# Patient Record
Sex: Female | Born: 1955 | Race: White | Hispanic: No | Smoking: Current every day smoker
Health system: Southern US, Community
[De-identification: ages and names within clinical notes are randomized; demographics above are authoritative.]

## PROBLEM LIST (undated history)

## (undated) DIAGNOSIS — F419 Anxiety disorder, unspecified: Secondary | ICD-10-CM

## (undated) DIAGNOSIS — Z8489 Family history of other specified conditions: Secondary | ICD-10-CM

## (undated) DIAGNOSIS — I671 Cerebral aneurysm, nonruptured: Secondary | ICD-10-CM

## (undated) DIAGNOSIS — M8580 Other specified disorders of bone density and structure, unspecified site: Secondary | ICD-10-CM

## (undated) DIAGNOSIS — M1990 Unspecified osteoarthritis, unspecified site: Secondary | ICD-10-CM

## (undated) DIAGNOSIS — T8859XA Other complications of anesthesia, initial encounter: Secondary | ICD-10-CM

## (undated) DIAGNOSIS — B029 Zoster without complications: Secondary | ICD-10-CM

## (undated) DIAGNOSIS — T7840XA Allergy, unspecified, initial encounter: Secondary | ICD-10-CM

## (undated) DIAGNOSIS — R42 Dizziness and giddiness: Secondary | ICD-10-CM

## (undated) HISTORY — PX: ABDOMINAL HYSTERECTOMY: SHX81

## (undated) HISTORY — DX: Allergy, unspecified, initial encounter: T78.40XA

## (undated) HISTORY — DX: Unspecified osteoarthritis, unspecified site: M19.90

## (undated) HISTORY — DX: Zoster without complications: B02.9

## (undated) HISTORY — DX: Other specified disorders of bone density and structure, unspecified site: M85.80

## (undated) HISTORY — DX: Anxiety disorder, unspecified: F41.9

## (undated) HISTORY — PX: CHOLECYSTECTOMY: SHX55

## (undated) HISTORY — PX: FOOT SURGERY: SHX648

## (undated) HISTORY — PX: OTHER SURGICAL HISTORY: SHX169

---

## 1992-01-10 HISTORY — PX: OTHER SURGICAL HISTORY: SHX169

## 1998-08-23 ENCOUNTER — Other Ambulatory Visit: Admission: RE | Admit: 1998-08-23 | Discharge: 1998-08-23 | Payer: Self-pay | Admitting: *Deleted

## 1999-09-07 ENCOUNTER — Other Ambulatory Visit: Admission: RE | Admit: 1999-09-07 | Discharge: 1999-09-07 | Payer: Self-pay | Admitting: *Deleted

## 2000-01-10 HISTORY — PX: NOSE SURGERY: SHX723

## 2000-08-30 ENCOUNTER — Other Ambulatory Visit: Admission: RE | Admit: 2000-08-30 | Discharge: 2000-08-30 | Payer: Self-pay | Admitting: *Deleted

## 2001-09-25 ENCOUNTER — Other Ambulatory Visit: Admission: RE | Admit: 2001-09-25 | Discharge: 2001-09-25 | Payer: Self-pay | Admitting: *Deleted

## 2003-07-06 ENCOUNTER — Other Ambulatory Visit: Admission: RE | Admit: 2003-07-06 | Discharge: 2003-07-06 | Payer: Self-pay | Admitting: *Deleted

## 2004-07-05 ENCOUNTER — Other Ambulatory Visit: Admission: RE | Admit: 2004-07-05 | Discharge: 2004-07-05 | Payer: Self-pay | Admitting: *Deleted

## 2004-08-21 ENCOUNTER — Encounter: Admission: RE | Admit: 2004-08-21 | Discharge: 2004-08-21 | Payer: Self-pay | Admitting: Internal Medicine

## 2005-07-20 ENCOUNTER — Other Ambulatory Visit: Admission: RE | Admit: 2005-07-20 | Discharge: 2005-07-20 | Payer: Self-pay | Admitting: *Deleted

## 2006-08-07 ENCOUNTER — Other Ambulatory Visit: Admission: RE | Admit: 2006-08-07 | Discharge: 2006-08-07 | Payer: Self-pay | Admitting: *Deleted

## 2007-08-12 ENCOUNTER — Other Ambulatory Visit: Admission: RE | Admit: 2007-08-12 | Discharge: 2007-08-12 | Payer: Self-pay | Admitting: Gynecology

## 2007-08-17 ENCOUNTER — Encounter: Admission: RE | Admit: 2007-08-17 | Discharge: 2007-08-17 | Payer: Self-pay | Admitting: Family Medicine

## 2008-08-12 ENCOUNTER — Encounter: Payer: Self-pay | Admitting: Women's Health

## 2008-08-12 ENCOUNTER — Ambulatory Visit: Payer: Self-pay | Admitting: Women's Health

## 2008-08-12 ENCOUNTER — Other Ambulatory Visit: Admission: RE | Admit: 2008-08-12 | Discharge: 2008-08-12 | Payer: Self-pay | Admitting: Gynecology

## 2008-09-10 ENCOUNTER — Encounter: Payer: Self-pay | Admitting: Gastroenterology

## 2008-10-01 ENCOUNTER — Encounter: Payer: Self-pay | Admitting: Gastroenterology

## 2008-11-03 ENCOUNTER — Ambulatory Visit: Payer: Self-pay | Admitting: Gastroenterology

## 2008-11-03 DIAGNOSIS — R1013 Epigastric pain: Secondary | ICD-10-CM | POA: Insufficient documentation

## 2008-11-06 ENCOUNTER — Encounter: Payer: Self-pay | Admitting: Gastroenterology

## 2008-11-06 ENCOUNTER — Ambulatory Visit: Payer: Self-pay | Admitting: Gastroenterology

## 2008-11-06 LAB — CONVERTED CEMR LAB
Amylase: 60 units/L (ref 27–131)
BUN: 5 mg/dL — ABNORMAL LOW (ref 6–23)
Basophils Relative: 1.1 % (ref 0.0–3.0)
CO2: 29 meq/L (ref 19–32)
Creatinine, Ser: 0.6 mg/dL (ref 0.4–1.2)
Eosinophils Relative: 1 % (ref 0.0–5.0)
GFR calc non Af Amer: 111.78 mL/min (ref >60)
Glucose, Bld: 80 mg/dL (ref 70–99)
HCT: 41.7 % (ref 36.0–46.0)
Hemoglobin: 15 g/dL (ref 12.0–15.0)
Lymphs Abs: 3.2 10*3/uL (ref 0.7–4.0)
Monocytes Relative: 5.6 % (ref 3.0–12.0)
Platelets: 244 10*3/uL (ref 150.0–400.0)
RBC: 4.28 M/uL (ref 3.87–5.11)
Total Bilirubin: 0.6 mg/dL (ref 0.3–1.2)
Total Protein: 6 g/dL (ref 6.0–8.3)
WBC: 8.8 10*3/uL (ref 4.5–10.5)

## 2008-11-09 ENCOUNTER — Telehealth: Payer: Self-pay | Admitting: Gastroenterology

## 2008-11-12 ENCOUNTER — Ambulatory Visit: Payer: Self-pay | Admitting: Gastroenterology

## 2008-11-12 ENCOUNTER — Telehealth: Payer: Self-pay | Admitting: Gastroenterology

## 2008-11-12 ENCOUNTER — Ambulatory Visit: Payer: Self-pay | Admitting: Internal Medicine

## 2008-11-18 ENCOUNTER — Ambulatory Visit: Payer: Self-pay | Admitting: Gastroenterology

## 2008-12-01 ENCOUNTER — Ambulatory Visit: Payer: Self-pay | Admitting: Gastroenterology

## 2008-12-01 LAB — CONVERTED CEMR LAB
Alkaline Phosphatase: 76 units/L (ref 39–117)
Bilirubin, Direct: 0 mg/dL (ref 0.0–0.3)
Total Protein: 6.2 g/dL (ref 6.0–8.3)

## 2008-12-02 ENCOUNTER — Telehealth (INDEPENDENT_AMBULATORY_CARE_PROVIDER_SITE_OTHER): Payer: Self-pay | Admitting: *Deleted

## 2008-12-02 ENCOUNTER — Ambulatory Visit (HOSPITAL_COMMUNITY): Admission: RE | Admit: 2008-12-02 | Discharge: 2008-12-02 | Payer: Self-pay | Admitting: Gastroenterology

## 2008-12-14 ENCOUNTER — Ambulatory Visit: Payer: Self-pay | Admitting: Gynecology

## 2008-12-18 ENCOUNTER — Ambulatory Visit (HOSPITAL_COMMUNITY): Admission: RE | Admit: 2008-12-18 | Discharge: 2008-12-18 | Payer: Self-pay | Admitting: Gastroenterology

## 2008-12-22 ENCOUNTER — Telehealth: Payer: Self-pay | Admitting: Gastroenterology

## 2009-01-18 ENCOUNTER — Ambulatory Visit (HOSPITAL_COMMUNITY): Admission: RE | Admit: 2009-01-18 | Discharge: 2009-01-18 | Payer: Self-pay | Admitting: General Surgery

## 2009-04-26 ENCOUNTER — Encounter: Admission: RE | Admit: 2009-04-26 | Discharge: 2009-04-26 | Payer: Self-pay | Admitting: Internal Medicine

## 2009-08-09 DIAGNOSIS — M8580 Other specified disorders of bone density and structure, unspecified site: Secondary | ICD-10-CM

## 2009-08-13 ENCOUNTER — Other Ambulatory Visit: Admission: RE | Admit: 2009-08-13 | Discharge: 2009-08-13 | Payer: Self-pay | Admitting: Gynecology

## 2009-08-13 ENCOUNTER — Ambulatory Visit: Payer: Self-pay | Admitting: Women's Health

## 2009-09-08 ENCOUNTER — Ambulatory Visit: Payer: Self-pay | Admitting: Women's Health

## 2009-11-24 ENCOUNTER — Encounter: Admission: RE | Admit: 2009-11-24 | Discharge: 2009-11-24 | Payer: Self-pay | Admitting: Otolaryngology

## 2010-03-27 LAB — CBC
HCT: 41.7 % (ref 36.0–46.0)
MCV: 96.3 fL (ref 78.0–100.0)
Platelets: 238 10*3/uL (ref 150–400)
RBC: 4.33 MIL/uL (ref 3.87–5.11)
WBC: 9 10*3/uL (ref 4.0–10.5)

## 2010-08-09 ENCOUNTER — Encounter: Payer: Self-pay | Admitting: *Deleted

## 2010-08-15 ENCOUNTER — Ambulatory Visit (INDEPENDENT_AMBULATORY_CARE_PROVIDER_SITE_OTHER): Payer: 59 | Admitting: Women's Health

## 2010-08-15 ENCOUNTER — Other Ambulatory Visit (HOSPITAL_COMMUNITY)
Admission: RE | Admit: 2010-08-15 | Discharge: 2010-08-15 | Disposition: A | Discharge status: Home / Self Care | Payer: 59 | Source: Ambulatory Visit | Attending: Obstetrics and Gynecology | Admitting: Obstetrics and Gynecology

## 2010-08-15 ENCOUNTER — Other Ambulatory Visit: Payer: Self-pay | Admitting: Women's Health

## 2010-08-15 ENCOUNTER — Encounter: Payer: Self-pay | Admitting: Women's Health

## 2010-08-15 VITALS — BP 110/70 | Ht 62.0 in | Wt 146.0 lb

## 2010-08-15 DIAGNOSIS — G4700 Insomnia, unspecified: Secondary | ICD-10-CM

## 2010-08-15 DIAGNOSIS — M8580 Other specified disorders of bone density and structure, unspecified site: Secondary | ICD-10-CM

## 2010-08-15 DIAGNOSIS — F419 Anxiety disorder, unspecified: Secondary | ICD-10-CM

## 2010-08-15 DIAGNOSIS — Z01419 Encounter for gynecological examination (general) (routine) without abnormal findings: Secondary | ICD-10-CM | POA: Insufficient documentation

## 2010-08-15 DIAGNOSIS — F411 Generalized anxiety disorder: Secondary | ICD-10-CM

## 2010-08-15 DIAGNOSIS — M899 Disorder of bone, unspecified: Secondary | ICD-10-CM

## 2010-08-15 MED ORDER — TRIAZOLAM 0.25 MG PO TABS: 0.2500 mg | ORAL_TABLET | Freq: Every evening | ORAL | Status: DC | PRN

## 2010-08-15 MED ORDER — DIAZEPAM 5 MG PO TABS: 5.0000 mg | ORAL_TABLET | Freq: Four times a day (QID) | ORAL | Status: AC | PRN

## 2010-08-15 NOTE — Progress Notes (Signed)
Sandra Waller 05/24/1955 [id number]    History:    The patient presents for annual exam.  55 yo MWF G2P2 LAVH with BSO in 52 for endometriosis. She is on no ERT.  She did negative colonoscopy in 2000 and, had her gallbladder  removed in January 2011. She had a bone density August of 2011 that showed osteopenia with a significant decrease at the spine. Spine showed a Tscore -2.4. She was to return to the office for labs and followup with Dr. Audie Box. She did not do that due to time getting away from her but states will schedule appointment with Dr. Audie Box to discuss.  She has had some problems with insomnia she does have her labs and Halcion 0.25 from her primary care but states does need  a prescription for that at this time.   Past medical history, past surgical history, family history and social history were all reviewed and documented in the EPIC chart.   ROS:  A 14 point ROS was performed and pertinent positives and negatives are included in the history.  Exam:  Filed Vitals:   08/15/10 1044  BP: 110/70    General appearance:  Normal Head/Neck:  Normal, without cervical or supraclavicular adenopathy. Thyroid:  Symmetrical, normal in size, without palpable masses or nodularity. Respiratory  Effort:  Normal  Auscultation:  Clear without wheezing or rhonchi Cardiovascular  Auscultation:  Regular rate, without rubs, murmurs or gallops  Edema/varicosities:  Not grossly evident Abdominal  Masses/tenderness:  Soft,nontender, without masses, guarding or rebound.  Liver/spleen:  No organomegaly noted  Hernia:  None appreciated  Occult test:   Skin  Inspection:  Grossly normal  Palpation:  Grossly normal Neurologic/psychiatric  Orientation:  Normal with appropriate conversation.  Mood/affect:  Normal  Genitourinary    Breasts: Examined lying and sitting.     Right: Without masses, retractions, discharge or axillary adenopathy.     Left: Without masses, retractions,  discharge or axillary adenopathy.   Inguinal/mons:  Normal without inguinal adenopathy  External genitalia:  Normal  BUS/Urethra/Skene's glands:  Normal  Bladder:  Normal  Vagina:  Normal  Cervix:  absent  Uterus: absent   Adnexa/parametria:     Rt: Without masses or tenderness.   Lt: Without masses or tenderness.  Anus and perineum: Normal  Digital rectal exam: Normal sphincter tone without palpated masses or tenderness  Assessment/Plan:  55 y.o. year old female for annual exam.   Will check a vitamin D, comprehensive metabolic panel., TFTs, and PTH, will schedule an office visit with Dr. Audie Box to discuss her osteopenia. Reviewed importance of fall prevention, smoking cessation. She states she does not feel she can quit smoking at this time. Will try to cut down, her father has died this year and is trying to settle his estate, he lived in [place] which has made it problematic. SBE's, yearly mammogram which was normal last month. Encouraged increased exercise, calcium rich diet vitamin D 2000 daily. Also does take occasional Valium which helps with vertigo but she states she rarely takes that. Did review not to take with the Halcion for rest. Sleep hygiene reviewed. UA and Pap today, she has her other labs done at her primary care.    Harrington Challenger MD, 11:54 AM 08/15/2010

## 2010-08-16 ENCOUNTER — Encounter: Payer: Self-pay | Admitting: *Deleted

## 2010-08-16 LAB — PTH, INTACT AND CALCIUM
Calcium, Total (PTH): 9.4 mg/dL (ref 8.4–10.5)
PTH: 30.6 pg/mL (ref 14.0–72.0)

## 2010-08-16 LAB — COMPREHENSIVE METABOLIC PANEL
CO2: 27 mEq/L (ref 19–32)
Creat: 0.69 mg/dL (ref 0.50–1.10)
Glucose, Bld: 76 mg/dL (ref 70–99)
Total Bilirubin: 0.4 mg/dL (ref 0.3–1.2)

## 2010-08-16 LAB — VITAMIN D 25 HYDROXY (VIT D DEFICIENCY, FRACTURES): Vit D, 25-Hydroxy: 48 ng/mL (ref 30–89)

## 2010-08-16 NOTE — Progress Notes (Signed)
  Patient was seen 8/6 by Sandra Waller [facility] was given 2 rxs they were valium 5mg  and halcion 0.25mg . Pt was given rxs without signatures therefore patient walked in today and wanted them signed, [place] not here therefore the rxs were called into [facility].

## 2010-08-17 LAB — TSH: TSH: 0.519 u[IU]/mL (ref 0.350–4.500)

## 2010-08-25 ENCOUNTER — Ambulatory Visit (INDEPENDENT_AMBULATORY_CARE_PROVIDER_SITE_OTHER): Payer: 59 | Admitting: Gynecology

## 2010-08-25 ENCOUNTER — Encounter: Payer: Self-pay | Admitting: Gynecology

## 2010-08-25 DIAGNOSIS — M899 Disorder of bone, unspecified: Secondary | ICD-10-CM

## 2010-08-25 DIAGNOSIS — M8580 Other specified disorders of bone density and structure, unspecified site: Secondary | ICD-10-CM

## 2010-08-25 MED ORDER — RISEDRONATE SODIUM 150 MG PO TABS: 150.0000 mg | ORAL_TABLET | ORAL | Status: DC

## 2010-08-25 NOTE — Progress Notes (Signed)
55 year old presents to discuss her bone density that was done one year ago showing a -2.4 T score at the AP spine. There was a statistically significant decline from her previous study 2 years prior 2009. She was to followup to discuss this but never did and now returns a year later. She had a recent PTH TSH calcium vitamin D all of which were normal. Her Z scores were also noted to be normal. I had lengthy discussion with the patient as far as osteopenia, -2.4 value, NOF guidelines, FRAX results and her history of cigarette smoking. I reviewed treatment options to include nonpharmacologic such as stop smoking increase weightbearing exercise as well as pharmacologic options to include bisphosphonates, Prolia, Evista and Forteo. After lengthy discussion we both agree on initiating Actonel 150 mg monthly. I reviewed how to take the medication side effect profile as well as the risks to include GERD, left untreated possible esophageal cancer, osteonecrosis of the jaw an atypical fracture risk. Patient understands and accepts he should go ahead and start her monthly dose we'll plan on repeating a bone density next year which will make it a 2 year interval and then we'll go from there.

## 2010-08-28 ENCOUNTER — Emergency Department (HOSPITAL_COMMUNITY)
Admission: EM | Admit: 2010-08-28 | Discharge: 2010-08-28 | Disposition: A | Discharge status: Home / Self Care | Payer: 59 | Attending: Emergency Medicine | Admitting: Emergency Medicine

## 2010-08-28 DIAGNOSIS — R079 Chest pain, unspecified: Secondary | ICD-10-CM | POA: Insufficient documentation

## 2010-08-28 DIAGNOSIS — M810 Age-related osteoporosis without current pathological fracture: Secondary | ICD-10-CM | POA: Insufficient documentation

## 2010-08-28 DIAGNOSIS — Z79899 Other long term (current) drug therapy: Secondary | ICD-10-CM | POA: Insufficient documentation

## 2010-08-28 LAB — COMPREHENSIVE METABOLIC PANEL
ALT: 11 U/L (ref 0–35)
AST: 19 U/L (ref 0–37)
Calcium: 9.4 mg/dL (ref 8.4–10.5)
Sodium: 138 mEq/L (ref 135–145)
Total Protein: 6.5 g/dL (ref 6.0–8.3)

## 2010-08-28 LAB — CK TOTAL AND CKMB (NOT AT ARMC): Relative Index: INVALID (ref 0.0–2.5)

## 2010-09-26 ENCOUNTER — Telehealth: Payer: Self-pay | Admitting: *Deleted

## 2010-09-26 DIAGNOSIS — M8580 Other specified disorders of bone density and structure, unspecified site: Secondary | ICD-10-CM

## 2010-09-26 MED ORDER — ALENDRONATE SODIUM 70 MG PO TABS: 70.0000 mg | ORAL_TABLET | ORAL | Status: DC

## 2010-09-26 NOTE — Telephone Encounter (Signed)
Spoke with pt and she would like to try the fosamax 70 mg weekly and see how this works.

## 2010-09-26 NOTE — Telephone Encounter (Signed)
Pt called stating the actonel 150 mg given on office visit on 08/25/10 gave her chest pain last month. Pt has not taken medication this month, but she wanted to follow up with you as directed. Please advise

## 2010-09-26 NOTE — Telephone Encounter (Signed)
Options would be trial of Fosamax 70 mg weekly. Lower dose may avoid some of the GI upset. Alternatives would be Prolia injection 2 times per year or Reclast once a year IV. I think if it's purely GI intolerance that Reclast yearly would be the best choice and we can make arrangements to set this up if she would like.

## 2010-12-20 ENCOUNTER — Other Ambulatory Visit: Payer: Self-pay | Admitting: Otolaryngology

## 2010-12-20 DIAGNOSIS — H9319 Tinnitus, unspecified ear: Secondary | ICD-10-CM

## 2010-12-23 ENCOUNTER — Other Ambulatory Visit: Payer: Self-pay | Admitting: Otolaryngology

## 2010-12-23 DIAGNOSIS — H9319 Tinnitus, unspecified ear: Secondary | ICD-10-CM

## 2010-12-24 ENCOUNTER — Ambulatory Visit
Admission: RE | Admit: 2010-12-24 | Discharge: 2010-12-24 | Disposition: A | Discharge status: Home / Self Care | Payer: 59 | Source: Ambulatory Visit | Attending: Otolaryngology | Admitting: Otolaryngology

## 2010-12-24 DIAGNOSIS — H9319 Tinnitus, unspecified ear: Secondary | ICD-10-CM

## 2010-12-24 MED ORDER — GADOBENATE DIMEGLUMINE 529 MG/ML IV SOLN
12.0000 mL | Freq: Once | INTRAVENOUS | Status: AC | PRN
  Administered 2010-12-24: 12 mL via INTRAVENOUS

## 2010-12-28 ENCOUNTER — Other Ambulatory Visit: Payer: Self-pay | Admitting: Otolaryngology

## 2010-12-28 DIAGNOSIS — H7490 Unspecified disorder of middle ear and mastoid, unspecified ear: Secondary | ICD-10-CM

## 2010-12-28 DIAGNOSIS — H8110 Benign paroxysmal vertigo, unspecified ear: Secondary | ICD-10-CM

## 2010-12-28 DIAGNOSIS — H93A9 Pulsatile tinnitus, unspecified ear: Secondary | ICD-10-CM

## 2011-01-11 ENCOUNTER — Ambulatory Visit
Admission: RE | Admit: 2011-01-11 | Discharge: 2011-01-11 | Disposition: A | Discharge status: Home / Self Care | Payer: 59 | Source: Ambulatory Visit | Attending: Otolaryngology | Admitting: Otolaryngology

## 2011-01-11 DIAGNOSIS — H7490 Unspecified disorder of middle ear and mastoid, unspecified ear: Secondary | ICD-10-CM

## 2011-01-11 DIAGNOSIS — H93A9 Pulsatile tinnitus, unspecified ear: Secondary | ICD-10-CM

## 2011-01-11 DIAGNOSIS — H8110 Benign paroxysmal vertigo, unspecified ear: Secondary | ICD-10-CM

## 2011-01-11 MED ORDER — IOHEXOL 300 MG/ML  SOLN
75.0000 mL | Freq: Once | INTRAMUSCULAR | Status: AC | PRN
  Administered 2011-01-11: 75 mL via INTRAVENOUS

## 2011-01-20 ENCOUNTER — Other Ambulatory Visit (HOSPITAL_COMMUNITY): Payer: Self-pay | Admitting: Neurology

## 2011-01-20 DIAGNOSIS — H93A9 Pulsatile tinnitus, unspecified ear: Secondary | ICD-10-CM

## 2011-01-24 ENCOUNTER — Encounter (HOSPITAL_COMMUNITY): Payer: Self-pay | Admitting: Pharmacy Technician

## 2011-01-26 ENCOUNTER — Other Ambulatory Visit: Payer: Self-pay | Admitting: Radiology

## 2011-01-31 ENCOUNTER — Encounter (HOSPITAL_COMMUNITY): Payer: Self-pay

## 2011-01-31 ENCOUNTER — Other Ambulatory Visit (HOSPITAL_COMMUNITY): Payer: Self-pay | Admitting: Neurology

## 2011-01-31 ENCOUNTER — Ambulatory Visit (HOSPITAL_COMMUNITY)
Admission: RE | Admit: 2011-01-31 | Discharge: 2011-01-31 | Disposition: A | Discharge status: Home / Self Care | Payer: 59 | Source: Ambulatory Visit | Attending: Neurology | Admitting: Neurology

## 2011-01-31 DIAGNOSIS — H93A9 Pulsatile tinnitus, unspecified ear: Secondary | ICD-10-CM

## 2011-01-31 DIAGNOSIS — R9409 Abnormal results of other function studies of central nervous system: Secondary | ICD-10-CM | POA: Insufficient documentation

## 2011-01-31 DIAGNOSIS — H9319 Tinnitus, unspecified ear: Secondary | ICD-10-CM | POA: Insufficient documentation

## 2011-01-31 DIAGNOSIS — I671 Cerebral aneurysm, nonruptured: Secondary | ICD-10-CM | POA: Insufficient documentation

## 2011-01-31 LAB — BASIC METABOLIC PANEL
CO2: 26 mEq/L (ref 19–32)
Chloride: 107 mEq/L (ref 96–112)
Creatinine, Ser: 0.69 mg/dL (ref 0.50–1.10)
Sodium: 141 mEq/L (ref 135–145)

## 2011-01-31 LAB — DIFFERENTIAL
Eosinophils Relative: 3 % (ref 0–5)
Lymphocytes Relative: 36 % (ref 12–46)
Lymphs Abs: 2.9 10*3/uL (ref 0.7–4.0)
Monocytes Absolute: 0.7 10*3/uL (ref 0.1–1.0)
Monocytes Relative: 9 % (ref 3–12)

## 2011-01-31 LAB — CBC
HCT: 42.4 % (ref 36.0–46.0)
MCV: 90 fL (ref 78.0–100.0)
RBC: 4.71 MIL/uL (ref 3.87–5.11)
WBC: 8.1 10*3/uL (ref 4.0–10.5)

## 2011-01-31 LAB — APTT: aPTT: 32 seconds (ref 24–37)

## 2011-01-31 MED ORDER — IOHEXOL 300 MG/ML  SOLN
150.0000 mL | Freq: Once | INTRAMUSCULAR | Status: AC | PRN
  Administered 2011-01-31: 75 mL via INTRAVENOUS

## 2011-01-31 MED ORDER — ONDANSETRON HCL 4 MG/2ML IJ SOLN
INTRAMUSCULAR | Status: AC | PRN
  Administered 2011-01-31: 4 mg via INTRAVENOUS

## 2011-01-31 MED ORDER — MIDAZOLAM HCL 2 MG/2ML IJ SOLN
INTRAMUSCULAR | Status: AC
  Filled 2011-01-31: qty 4

## 2011-01-31 MED ORDER — FLUMAZENIL 0.5 MG/5ML IV SOLN
INTRAVENOUS | Status: AC
  Filled 2011-01-31: qty 5

## 2011-01-31 MED ORDER — FENTANYL CITRATE 0.05 MG/ML IJ SOLN
INTRAMUSCULAR | Status: AC | PRN
  Administered 2011-01-31: 25 ug via INTRAVENOUS

## 2011-01-31 MED ORDER — SODIUM CHLORIDE 0.9 % IV BOLUS (SEPSIS)
250.0000 mL | Freq: Once | INTRAVENOUS | Status: AC
  Administered 2011-01-31: 250 mL via INTRAVENOUS

## 2011-01-31 MED ORDER — SODIUM CHLORIDE 0.9 % IV SOLN: INTRAVENOUS | Status: DC

## 2011-01-31 MED ORDER — ONDANSETRON HCL 4 MG/2ML IJ SOLN
INTRAMUSCULAR | Status: AC
  Filled 2011-01-31: qty 2

## 2011-01-31 MED ORDER — HEPARIN SOD (PORK) LOCK FLUSH 100 UNIT/ML IV SOLN
500.0000 [IU] | Freq: Once | INTRAVENOUS | Status: AC
  Administered 2011-01-31: 500 [IU] via INTRAVENOUS

## 2011-01-31 MED ORDER — NALOXONE HCL 1 MG/ML IJ SOLN
INTRAMUSCULAR | Status: AC
  Filled 2011-01-31: qty 2

## 2011-01-31 MED ORDER — SODIUM CHLORIDE 0.9 % IV BOLUS (SEPSIS): 250.0000 mL | Freq: Once | INTRAVENOUS | Status: DC

## 2011-01-31 MED ORDER — SODIUM CHLORIDE 0.9 % IV SOLN: INTRAVENOUS | Status: AC

## 2011-01-31 MED ORDER — MIDAZOLAM HCL 5 MG/5ML IJ SOLN
INTRAMUSCULAR | Status: AC | PRN
  Administered 2011-01-31: 1 mg via INTRAVENOUS

## 2011-01-31 MED ORDER — FENTANYL CITRATE 0.05 MG/ML IJ SOLN
INTRAMUSCULAR | Status: AC
  Filled 2011-01-31: qty 4

## 2011-01-31 NOTE — H&P (Signed)
Sandra Waller is an 56 y.o. female.   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 Osteopenia 08/2009    T-score -2.4 AP spine    Past Surgical History  Procedure Date  . Lavh bso     OVARIAN CYSTS AND ENDOMETRIOSIS  . Abdominal hysterectomy     LAVH  . Nose surgery 2002  . Cysts on wrist removed 89  . Left shoudler surgery   . Cholecystectomy     Family History  Problem Relation Age of Onset  . Diabetes Mother   . Hypertension Sister   . Diabetes Father    Social History:  reports that she has been smoking Cigarettes.  She has a 15 pack-year smoking history. She has never used smokeless tobacco. She reports that she does not drink alcohol or use illicit drugs.  Allergies:  Allergies  Allergen Reactions  . Celebrex (Celecoxib) Nausea Only  . Z-Pak (Azithromycin) Nausea Only  . Adhesive (Tape) Rash    Medications Prior to Admission  Medication Sig Dispense Refill  . alendronate (FOSAMAX) 70 MG tablet Take 70 mg by mouth every 7 (seven) days. Take with a full glass of water on an empty stomach.      . diazepam (VALIUM) 5 MG tablet Take 5 mg by mouth at bedtime as needed.      Marland Kitchen glucosamine-chondroitin 500-400 MG tablet Take 1 tablet by mouth daily.      . Multiple Vitamins-Minerals (MULTIVITAMINS THER. W/MINERALS) TABS Take 1 tablet by mouth daily.      Marland Kitchen OVER THE COUNTER MEDICATION Take 1 scoop by mouth daily. Total Soy supplement shake      . triazolam (HALCION) 0.25 MG tablet Take 0.25 mg by mouth at bedtime.       Medications Prior to Admission  Medication Dose Route Frequency Provider Last Rate Last Dose  . 0.9 %  sodium chloride infusion   Intravenous Continuous Robet Leu, PA        No results found for this or any previous visit (from the past 48 hour(s)). No results found.  Review of Systems    Constitutional: Negative for fever and chills.  Respiratory: Negative for cough.   Cardiovascular: Negative for chest pain.  Gastrointestinal: Negative for nausea, vomiting and abdominal pain.  Neurological: Negative for headaches.    Blood pressure 97/62, pulse 84, temperature 97 F (36.1 C), temperature source Oral, resp. rate 18, height 5\' 2"  (1.575 m), weight 142 lb (64.411 kg), SpO2 98.00%. Physical Exam  Constitutional: She is oriented to person, place, and time. She appears well-developed and well-nourished.  HENT:  Head: Normocephalic.  Eyes: EOM are normal.  Neck: Normal range of motion.  Cardiovascular: Normal rate, regular rhythm and normal heart sounds.   No murmur heard. Respiratory: Effort normal and breath sounds normal. She has no wheezes.  GI: Soft. Bowel sounds are normal. There is no tenderness.  Musculoskeletal: Normal range of motion.  Neurological: She is alert and oriented to person, place, and time.  Skin: Skin is warm and dry.     Assessment/Plan Rt pulsatile tinnitus-resolved; but has had abn MRA that shows R ICA poss aneurysm Scheduled now for cerebral arteriogram  Aware of procedure benefits and risks and agreeable to proceed. Consent signed.  Sandra Waller A 01/31/2011, 8:45 AM

## 2011-01-31 NOTE — Procedures (Signed)
S/P cerebral arteriogram 4 vessel. Rt CFA approach. Preliminary findings. 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 No acute complications. Full report to follow.

## 2011-02-01 ENCOUNTER — Other Ambulatory Visit (HOSPITAL_COMMUNITY): Payer: Self-pay | Admitting: Interventional Radiology

## 2011-02-01 DIAGNOSIS — H93A9 Pulsatile tinnitus, unspecified ear: Secondary | ICD-10-CM

## 2011-02-06 ENCOUNTER — Other Ambulatory Visit (HOSPITAL_COMMUNITY): Payer: Self-pay | Admitting: Neurology

## 2011-02-06 DIAGNOSIS — H93A9 Pulsatile tinnitus, unspecified ear: Secondary | ICD-10-CM

## 2011-02-14 ENCOUNTER — Ambulatory Visit (HOSPITAL_COMMUNITY): Payer: 59

## 2011-03-11 ENCOUNTER — Other Ambulatory Visit: Payer: Self-pay | Admitting: Women's Health

## 2011-03-13 NOTE — Telephone Encounter (Signed)
rx called in

## 2011-03-13 NOTE — Telephone Encounter (Signed)
Telephone call to patient, states needs Halcion to sleep. States takes no other medications. Sleep hygiene reviewed. RX called in.

## 2011-05-18 ENCOUNTER — Ambulatory Visit (INDEPENDENT_AMBULATORY_CARE_PROVIDER_SITE_OTHER): Payer: 59 | Admitting: Family Medicine

## 2011-05-18 DIAGNOSIS — B029 Zoster without complications: Secondary | ICD-10-CM

## 2011-05-18 MED ORDER — VALACYCLOVIR HCL 1 G PO TABS: 1000.0000 mg | ORAL_TABLET | Freq: Three times a day (TID) | ORAL | Status: DC

## 2011-05-18 MED ORDER — GABAPENTIN 100 MG PO CAPS: ORAL_CAPSULE | ORAL | Status: DC

## 2011-05-18 MED ORDER — PREDNISONE 20 MG PO TABS: 20.0000 mg | ORAL_TABLET | Freq: Every day | ORAL | Status: AC

## 2011-05-18 NOTE — Progress Notes (Signed)
  Subjective:    Patient ID: Sandra Waller, female    DOB: [birth year], 56 y.o.   MRN: [id number]  HPI  (R) flank parathesia's without rash in a dermatomal distribution No history of Zoster  No recent illness or stressors No known thorasic spine disease  Review of Systems     Objective:   Physical Exam  Constitutional: She appears well-developed.  Cardiovascular: Normal rate, regular rhythm and normal heart sounds.   Pulmonary/Chest: Effort normal and breath sounds normal.  Abdominal: Soft. Bowel sounds are normal.  Skin:       Dysesthesia's along (R) flank extending over to (R) upper abdomen        Assessment & Plan:  Herpes Zoster; presumed  Valtrex 1 gm TID X 7 days Prednisone 20 mg 2 daily for 7 days Neurontin 100 mg see taper to 300 mg daily

## 2011-06-03 ENCOUNTER — Other Ambulatory Visit: Payer: Self-pay | Admitting: Gynecology

## 2011-06-10 DIAGNOSIS — B029 Zoster without complications: Secondary | ICD-10-CM

## 2011-06-10 HISTORY — DX: Zoster without complications: B02.9

## 2011-06-29 ENCOUNTER — Other Ambulatory Visit: Payer: Self-pay | Admitting: Family Medicine

## 2011-07-04 ENCOUNTER — Ambulatory Visit (INDEPENDENT_AMBULATORY_CARE_PROVIDER_SITE_OTHER): Payer: 59 | Admitting: Physician Assistant

## 2011-07-04 VITALS — BP 114/75 | HR 81 | Temp 97.9°F | Resp 16 | Ht 61.5 in | Wt 143.2 lb

## 2011-07-04 DIAGNOSIS — R590 Localized enlarged lymph nodes: Secondary | ICD-10-CM

## 2011-07-04 DIAGNOSIS — R599 Enlarged lymph nodes, unspecified: Secondary | ICD-10-CM

## 2011-07-04 DIAGNOSIS — R51 Headache: Secondary | ICD-10-CM

## 2011-07-04 DIAGNOSIS — B029 Zoster without complications: Secondary | ICD-10-CM

## 2011-07-04 MED ORDER — FLUTICASONE PROPIONATE 50 MCG/ACT NA SUSP: 2.0000 | Freq: Every day | NASAL | Status: DC

## 2011-07-04 MED ORDER — HYDROCODONE-ACETAMINOPHEN 5-500 MG PO TABS: 1.0000 | ORAL_TABLET | Freq: Three times a day (TID) | ORAL | Status: AC | PRN

## 2011-07-04 MED ORDER — KETOROLAC TROMETHAMINE 60 MG/2ML IM SOLN
60.0000 mg | Freq: Once | INTRAMUSCULAR | Status: AC
  Administered 2011-07-04: 60 mg via INTRAMUSCULAR

## 2011-07-04 MED ORDER — IBUPROFEN 800 MG PO TABS: 800.0000 mg | ORAL_TABLET | Freq: Three times a day (TID) | ORAL | Status: AC | PRN

## 2011-07-04 NOTE — Progress Notes (Signed)
  Subjective:    Patient ID: Sandra Waller, female    DOB: November 27, 1955, 56 y.o.   MRN: [id number]  HPI56 yr old CF here with Shingles on her head and forehead.  She was diagnosed with shingles 1 month ago by Dr. Alma Downs. It was present on her R side/hip area.  She took valtrex.  Last week she was in [place] and started having headaches and rash erupted.  She saw an MD there and was prescribed Valtrex which she has been on for 5 days now.  She started prednisone 2 days ago that Dr. Alma Downs had given her last month but she didn't need then.  She has an appointment with an opthalmologist at noon today bc her L eye is bothering her.  She is having painful headaches, pain in L ear.  She came in today bc she was concerned about 2 lymph nodes behind her L ear that just popped up.  They are very tender.  Sinuses also painful from congestion.  Review of Systems  All other systems reviewed and are negative.       Objective:   Physical Exam  Nursing note and vitals reviewed. Constitutional: She is oriented to person, place, and time. She appears well-developed and well-nourished.  HENT:  Head: Normocephalic and atraumatic.  Right Ear: External ear normal.  Mouth/Throat: Oropharynx is clear and moist. No oropharyngeal exudate.       L TM and canal with mild erythema(not infection). No discreet vesicles.  Eyes: EOM are normal. Pupils are equal, round, and reactive to light.       L eye with mild erythema.  (Not stained bc she is immediately going to ophthalmologist from here).  Neck: Normal range of motion. Neck supple.       #2 <1cm mobile, tender, and shotty post-auricular lymph nodes behind L ear  Cardiovascular: Normal rate, regular rhythm and normal heart sounds.   Pulmonary/Chest: Effort normal and breath sounds normal.  Lymphadenopathy:    She has no cervical adenopathy.  Neurological: She is alert and oriented to person, place, and time. No cranial nerve deficit.  Skin: Skin is warm, dry and  intact. Rash noted. Rash is vesicular.             Assessment & Plan:  Shingles Headache-caused by #1 Post-auricular Lymphadenopthy-secondary to #1 Toradol IM now.  Continue valtrex and prednisone.  Go directly to opthalmology appt.  Recheck in 2 days.  Re-examine for LN in 3-4 weeks.  Rx handwritten for shingles vaccine when well.

## 2011-07-12 ENCOUNTER — Ambulatory Visit (INDEPENDENT_AMBULATORY_CARE_PROVIDER_SITE_OTHER): Payer: 59 | Admitting: Family Medicine

## 2011-07-12 VITALS — BP 108/62 | HR 80 | Temp 98.0°F | Resp 16 | Ht 62.5 in | Wt 145.2 lb

## 2011-07-12 DIAGNOSIS — G479 Sleep disorder, unspecified: Secondary | ICD-10-CM

## 2011-07-12 DIAGNOSIS — B0229 Other postherpetic nervous system involvement: Secondary | ICD-10-CM

## 2011-07-12 DIAGNOSIS — R51 Headache: Secondary | ICD-10-CM

## 2011-07-12 MED ORDER — TRIAZOLAM 0.25 MG PO TABS: ORAL_TABLET | ORAL | Status: DC

## 2011-07-12 MED ORDER — GABAPENTIN 300 MG PO CAPS: 300.0000 mg | ORAL_CAPSULE | Freq: Three times a day (TID) | ORAL | Status: DC

## 2011-07-12 NOTE — Patient Instructions (Signed)
Progress report on the gabapentin until taking 900 mg daily Take the sleeping medication 2 pills at bedtime. Cut back if causing side effects.

## 2011-07-12 NOTE — Progress Notes (Signed)
Subjective: Patient continues with a little left-sided headache day and night since having the shingles. She tried the gabapentin for a little while and didn't seem to help much she did not continue it. She still has a lot of it. She's been trying the pain pills and continues to her sleeping medicine. She's not sleeping well.  Objective: Tenderness in left side of the scalp. Skin has healed up well. TMs normal. Eyes PERRLA. Throat clear. Neck supple without nodes.  Assessment: Postherpetic neuralgia and associated headache Sleep disturbance  Plan Push the Neurontin up to 300 mg 3 times a day. Increase the Halcion to 0.5 mg at bedtime (2x0.25 (  Return in 2-3 weeks, sooner if worse.  Discussed possible side effects of pushing the medicine doses.

## 2011-07-19 ENCOUNTER — Other Ambulatory Visit: Payer: Self-pay | Admitting: Neurology

## 2011-07-19 DIAGNOSIS — I729 Aneurysm of unspecified site: Secondary | ICD-10-CM

## 2011-07-22 ENCOUNTER — Ambulatory Visit
Admission: RE | Admit: 2011-07-22 | Discharge: 2011-07-22 | Disposition: A | Discharge status: Home / Self Care | Payer: 59 | Source: Ambulatory Visit | Attending: Neurology | Admitting: Neurology

## 2011-07-22 DIAGNOSIS — I729 Aneurysm of unspecified site: Secondary | ICD-10-CM

## 2011-08-07 ENCOUNTER — Encounter: Payer: Self-pay | Admitting: Women's Health

## 2011-08-28 ENCOUNTER — Encounter: Payer: Self-pay | Admitting: Women's Health

## 2011-08-28 ENCOUNTER — Ambulatory Visit (INDEPENDENT_AMBULATORY_CARE_PROVIDER_SITE_OTHER): Payer: 59 | Admitting: Women's Health

## 2011-08-28 VITALS — BP 108/70 | Ht 62.25 in | Wt 150.0 lb

## 2011-08-28 DIAGNOSIS — Z01419 Encounter for gynecological examination (general) (routine) without abnormal findings: Secondary | ICD-10-CM

## 2011-08-28 DIAGNOSIS — M949 Disorder of cartilage, unspecified: Secondary | ICD-10-CM

## 2011-08-28 DIAGNOSIS — M8580 Other specified disorders of bone density and structure, unspecified site: Secondary | ICD-10-CM

## 2011-08-28 DIAGNOSIS — M899 Disorder of bone, unspecified: Secondary | ICD-10-CM

## 2011-08-28 MED ORDER — ALENDRONATE SODIUM 70 MG PO TABS: 70.0000 mg | ORAL_TABLET | ORAL | Status: DC

## 2011-08-28 NOTE — Progress Notes (Signed)
Sandra Waller February 12, 1955 [id number]    History:    The patient presents for annual exam.  Postmenopausal,  TVH with BSO in 89 for ovarian cysts and endometriosis. History of osteopenia diagnosed in 2009, last DEXA 2011 showed a T score of -2.4 at spine, bilateral hip average T score -0.8, left femoral neck T score -1.5, on Fosamax 70 weekly here. History of normal Paps, mammograms have shown small nodules that were stable and is now back to annual screens.. 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 Vela Prose, diagnosed on CT scan, done due to vertigo, states has gotten better. Normal colonoscopy in 2011. Had shingles 05/2011.   Past medical history, past surgical history, family history and social history were all reviewed and documented in the EPIC chart. 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 2 grown sons both doing well.   ROS:  A  ROS was performed and pertinent positives and negatives are included in the history.  Exam:  Filed Vitals:   08/28/11 0837  BP: 108/70    General appearance:  Normal Head/Neck:  Normal, without cervical or supraclavicular adenopathy. Thyroid:  Symmetrical, normal in size, without palpable masses or nodularity. Respiratory  Effort:  Normal  Auscultation:  Clear without wheezing or rhonchi Cardiovascular  Auscultation:  Regular rate, without rubs, murmurs or gallops  Edema/varicosities:  Not grossly evident Abdominal  Soft,nontender, without masses, guarding or rebound.  Liver/spleen:  No organomegaly noted  Hernia:  None appreciated  Skin  Inspection:  Grossly normal  Palpation:  Grossly normal Neurologic/psychiatric  Orientation:  Normal with appropriate conversation.  Mood/affect:  Normal  Genitourinary    Breasts: Examined lying and sitting.     Right: Without masses, retractions, discharge or axillary adenopathy.     Left: Without masses, retractions, discharge or axillary  adenopathy.   Inguinal/mons:  Normal without inguinal adenopathy  External genitalia:  Normal  BUS/Urethra/Skene's glands:  Normal  Bladder:  Normal  Vagina:  Normal  Cervix:  Absent  Uterus:  Absent  Adnexa/parametria:     Rt: Without masses or tenderness.   Lt: Without masses or tenderness.  Anus and perineum: Normal  Digital rectal exam: Normal sphincter tone without palpated masses or tenderness  Assessment/Plan:  56 y.o. M. WF G2 P2 for annual exam with no complaints.  Postmenopausal  LVH with BSO on no ERT Osteopenia T score of -2.4 at spine 2011 Smoker half pack per day Labs primary care  Plan: Home Hemoccult card given with instructions. SBE's, continue annual mammogram, calcium rich diet, vitamin D 2000 daily. Keep scheduled DEXA appointment, Fosamax 70 weekly, states has trouble remembering. Reviewed importance of increasing regular exercise for general health and bone health. Aware of hazards of smoking discussed ways to decrease and quit.      Harrington Challenger WHNP, 9:08 AM 08/28/2011

## 2011-08-28 NOTE — Patient Instructions (Signed)

## 2011-08-29 LAB — URINALYSIS W MICROSCOPIC + REFLEX CULTURE
Bacteria, UA: NONE SEEN
Casts: NONE SEEN
Hgb urine dipstick: NEGATIVE
Ketones, ur: NEGATIVE mg/dL
Nitrite: NEGATIVE
Urobilinogen, UA: 0.2 mg/dL (ref 0.0–1.0)
pH: 7 (ref 5.0–8.0)

## 2011-09-01 ENCOUNTER — Encounter: Payer: Self-pay | Admitting: Women's Health

## 2011-09-06 ENCOUNTER — Other Ambulatory Visit: Payer: Self-pay | Admitting: Gynecology

## 2011-09-06 DIAGNOSIS — M8580 Other specified disorders of bone density and structure, unspecified site: Secondary | ICD-10-CM

## 2011-10-10 ENCOUNTER — Ambulatory Visit (INDEPENDENT_AMBULATORY_CARE_PROVIDER_SITE_OTHER): Payer: 59 | Admitting: Physician Assistant

## 2011-10-10 ENCOUNTER — Ambulatory Visit (INDEPENDENT_AMBULATORY_CARE_PROVIDER_SITE_OTHER): Payer: 59

## 2011-10-10 VITALS — BP 104/76 | HR 84 | Temp 98.0°F | Resp 16 | Ht 61.58 in | Wt 148.0 lb

## 2011-10-10 DIAGNOSIS — M899 Disorder of bone, unspecified: Secondary | ICD-10-CM

## 2011-10-10 DIAGNOSIS — M792 Neuralgia and neuritis, unspecified: Secondary | ICD-10-CM

## 2011-10-10 DIAGNOSIS — IMO0002 Reserved for concepts with insufficient information to code with codable children: Secondary | ICD-10-CM

## 2011-10-10 DIAGNOSIS — M8580 Other specified disorders of bone density and structure, unspecified site: Secondary | ICD-10-CM

## 2011-10-10 DIAGNOSIS — Z23 Encounter for immunization: Secondary | ICD-10-CM

## 2011-10-10 MED ORDER — VALACYCLOVIR HCL 1 G PO TABS: 1000.0000 mg | ORAL_TABLET | Freq: Three times a day (TID) | ORAL | Status: DC

## 2011-10-10 MED ORDER — GABAPENTIN 300 MG PO CAPS: 300.0000 mg | ORAL_CAPSULE | Freq: Two times a day (BID) | ORAL | Status: DC

## 2011-10-10 NOTE — Progress Notes (Signed)
  Subjective:    Patient ID: Sandra Waller, female    DOB: [birth year], 56 y.o.   MRN: [id number]  HPI 56 yr old CF presents with pain in the L area of her head where she had shingles this past summer.  She took a gabapentin last night and she has taken 2 today which seems to be helping with the pain.  She is worried that she is going to have another episode of shingles.  She has not had any fever or systemic signs of illness. No lesions.  Review of Systems  All other systems reviewed and are negative.       Objective:   Physical Exam  Nursing note and vitals reviewed. Constitutional: She is oriented to person, place, and time. She appears well-developed and well-nourished.  HENT:  Head: Normocephalic and atraumatic.  Eyes: Conjunctivae normal and EOM are normal. Pupils are equal, round, and reactive to light.  Cardiovascular: Normal rate, regular rhythm and normal heart sounds.   Pulmonary/Chest: Effort normal and breath sounds normal.  Neurological: She is alert and oriented to person, place, and time.  Skin: Skin is warm and dry.    L face and scalp without any lesion      Assessment & Plan:  Neuralgia-doubt she is going to have another episode of shingles, but I sent a prescription of Valtrex in for reassurance, and she agrees she will only take it if she develops any lesions. Flu shot given.

## 2011-10-16 ENCOUNTER — Telehealth: Payer: Self-pay | Admitting: Women's Health

## 2011-10-16 NOTE — Telephone Encounter (Signed)
Message left on cell to call in regards to DEXA

## 2011-11-16 ENCOUNTER — Other Ambulatory Visit: Payer: Self-pay | Admitting: Family Medicine

## 2011-11-18 ENCOUNTER — Other Ambulatory Visit: Payer: Self-pay | Admitting: *Deleted

## 2011-12-02 ENCOUNTER — Other Ambulatory Visit: Payer: Self-pay | Admitting: Internal Medicine

## 2011-12-03 NOTE — Telephone Encounter (Signed)
Need more info.  What does she take this for?

## 2011-12-16 ENCOUNTER — Other Ambulatory Visit: Payer: Self-pay | Admitting: Physician Assistant

## 2011-12-16 DIAGNOSIS — G479 Sleep disorder, unspecified: Secondary | ICD-10-CM

## 2011-12-16 NOTE — Telephone Encounter (Signed)
Call:  May refill once but must be seen before any more.

## 2011-12-19 NOTE — Telephone Encounter (Signed)
Patient advised.

## 2011-12-24 ENCOUNTER — Ambulatory Visit (INDEPENDENT_AMBULATORY_CARE_PROVIDER_SITE_OTHER): Payer: 59 | Admitting: Internal Medicine

## 2011-12-24 VITALS — BP 102/66 | HR 88 | Temp 97.8°F | Resp 16 | Ht 62.0 in | Wt 147.0 lb

## 2011-12-24 DIAGNOSIS — F411 Generalized anxiety disorder: Secondary | ICD-10-CM

## 2011-12-24 DIAGNOSIS — G479 Sleep disorder, unspecified: Secondary | ICD-10-CM

## 2011-12-24 DIAGNOSIS — M545 Low back pain, unspecified: Secondary | ICD-10-CM

## 2011-12-24 DIAGNOSIS — F17200 Nicotine dependence, unspecified, uncomplicated: Secondary | ICD-10-CM | POA: Insufficient documentation

## 2011-12-24 DIAGNOSIS — E785 Hyperlipidemia, unspecified: Secondary | ICD-10-CM | POA: Insufficient documentation

## 2011-12-24 DIAGNOSIS — G5600 Carpal tunnel syndrome, unspecified upper limb: Secondary | ICD-10-CM

## 2011-12-24 DIAGNOSIS — M1990 Unspecified osteoarthritis, unspecified site: Secondary | ICD-10-CM

## 2011-12-24 DIAGNOSIS — B0229 Other postherpetic nervous system involvement: Secondary | ICD-10-CM

## 2011-12-24 DIAGNOSIS — G4700 Insomnia, unspecified: Secondary | ICD-10-CM | POA: Insufficient documentation

## 2011-12-24 MED ORDER — DIAZEPAM 5 MG PO TABS: 5.0000 mg | ORAL_TABLET | Freq: Three times a day (TID) | ORAL | Status: DC | PRN

## 2011-12-24 MED ORDER — CYCLOBENZAPRINE HCL 10 MG PO TABS: ORAL_TABLET | ORAL | Status: DC

## 2011-12-24 MED ORDER — GABAPENTIN 300 MG PO CAPS: 300.0000 mg | ORAL_CAPSULE | Freq: Two times a day (BID) | ORAL | Status: DC

## 2011-12-24 MED ORDER — MELOXICAM 15 MG PO TABS: 15.0000 mg | ORAL_TABLET | Freq: Every day | ORAL | Status: DC

## 2011-12-24 MED ORDER — FLUOXETINE HCL 20 MG PO TABS: 20.0000 mg | ORAL_TABLET | Freq: Every day | ORAL | Status: DC

## 2011-12-24 MED ORDER — TRIAZOLAM 0.25 MG PO TABS: ORAL_TABLET | ORAL | Status: DC

## 2011-12-24 NOTE — Progress Notes (Signed)
Subjective:    Patient ID: Sandra Waller, female    DOB: [birth year], 56 y.o.   MRN: [id number]  HPIf/u for multiple issues Sleep--insomnia has responded to 0.5 mg of Halcion very well/she has had trouble getting refills through our phone system  Increased Anxiety-back in kindergarten class//son out of work/Mom in [place] needing support$ Pervasive without panic/better with valium//depression not an issue  Lots of musculoske; issues, esp Ankles/wrists/lower back--back pain worse as the day goes on/no radicular symptoms/lots of bending in the classroom ankles her in the morning she gets out of bed and hurt to go up and down stairs/never red or swollen Hands have numbness and tingling at night/some numbness when drives for long distance/also pain in the wrist area with use most days/not swollen/history of ganglion cyst removal in both wrists Fingers nonaffected/history of osteoporosis that responded Fosamax and improved to the point of osteopenia/most recent DEXA suggested no need for medication  Following herpes zoster in May 2013, she has had continued problems with tingling pain in the left for head region/no change in vision/continues to use Neurontin with success  Has reduced smoking to half pack daily Review of Systems  Constitutional: Negative for activity change, appetite change, fatigue and unexpected weight change.  HENT: Negative for trouble swallowing, neck pain and voice change.   Eyes: Negative for visual disturbance.  Respiratory: Negative for cough and shortness of breath.   Cardiovascular: Negative for chest pain, palpitations and leg swelling.  Gastrointestinal: Negative for nausea, abdominal pain, diarrhea, constipation and blood in stool.  Genitourinary: Negative for frequency and difficulty urinating.  Musculoskeletal: Negative for gait problem.  Skin: Negative for rash.  Neurological: Negative for headaches.       Recent episode of vertigo that cleared with  treatment  Psychiatric/Behavioral: Negative for hallucinations, confusion, self-injury and agitation.   Osteoporosis-fosamax weekly(bone density improving-[place])     Last tetanustdap= 2009 Colonoscopy 2010 Objective:   Physical Exam Vital signs stable HEENT clear No thyromegaly or lymphadenopathy Lungs clear Heart regular There is pain in the wrists bilaterally with general range of motion and with palpation over the carpal areas Tinel's negative bilaterally MCPs, DIPs, and PIPs are clear Neck has a fair range of motion There is some tenderness over the lumbar muscles to palpation Straight leg raise is negative to 90 bilaterally Deep tendon reflexes are symmetrical There no distal sensory losses       Assessment & Plan:   1. Sleep disturbance ----continue triazolam   2. PHN (postherpetic neuralgia) --zostavax /continue Neurontin   3. GAD (generalized anxiety disorder) -add Prozac 20 mg /continue Valium when necessary   4. Lumbar back pain -add Flexeril at bedtime /start exercises for 3 months /consider yoga   5. Osteoarthritis -add meloxicam  6. CTS (carpal tunnel syndrome) -range of motion exercises started   7. Hyperlipidemia -recheck labs   8. Nicotine addiction -advise continue cessation       Meds ordered this encounter  Medications  . triazolam (HALCION) 0.25 MG tablet    Sig: Take 2 tablets (0.5 mg) at bedtime as needed    Dispense:  68 tablet    Refill:  5  . gabapentin (NEURONTIN) 300 MG capsule    Sig: Take 1 capsule (300 mg total) by mouth 2 (two) times daily.    Dispense:  68 capsule    Refill:  2  . diazepam (VALIUM) 5 MG tablet    Sig: Take 1 tablet (5 mg total) by mouth every 8 (  eight) hours as needed.    Dispense:  90 tablet    Refill:  1  . FLUoxetine (PROZAC) 20 MG tablet    Sig: Take 1 tablet (20 mg total) by mouth daily.    Dispense:  34 tablet    Refill:  5  . cyclobenzaprine (FLEXERIL) 10 MG tablet    Sig: At bedtime     Dispense:  34 tablet    Refill:  5  . meloxicam (MOBIC) 15 MG tablet    Sig: Take 1 tablet (15 mg total) by mouth daily.    Dispense:  34 tablet    Refill:  5

## 2011-12-25 ENCOUNTER — Other Ambulatory Visit: Payer: Self-pay | Admitting: Radiology

## 2012-01-01 NOTE — Progress Notes (Signed)
Received PA request for Zostavax vaccine for pt and called [employer]. Spoke w/three different reps and was told that the medication itself is covered but the administration fee is not. Notified pharmacy and pt of this info.

## 2012-07-15 ENCOUNTER — Other Ambulatory Visit: Payer: Self-pay | Admitting: Internal Medicine

## 2012-07-23 ENCOUNTER — Other Ambulatory Visit: Payer: Self-pay | Admitting: Physician Assistant

## 2012-07-24 ENCOUNTER — Other Ambulatory Visit: Payer: Self-pay | Admitting: Neurology

## 2012-07-24 DIAGNOSIS — I720 Aneurysm of carotid artery: Secondary | ICD-10-CM

## 2012-07-25 ENCOUNTER — Ambulatory Visit
Admission: RE | Admit: 2012-07-25 | Discharge: 2012-07-25 | Disposition: A | Discharge status: Home / Self Care | Payer: 59 | Source: Ambulatory Visit | Attending: Neurology | Admitting: Neurology

## 2012-07-25 DIAGNOSIS — I720 Aneurysm of carotid artery: Secondary | ICD-10-CM

## 2012-07-31 ENCOUNTER — Encounter: Payer: Self-pay | Admitting: Internal Medicine

## 2012-07-31 ENCOUNTER — Ambulatory Visit (INDEPENDENT_AMBULATORY_CARE_PROVIDER_SITE_OTHER): Payer: 59 | Admitting: Internal Medicine

## 2012-07-31 VITALS — BP 106/72 | HR 81 | Temp 98.1°F | Resp 16 | Ht 62.5 in | Wt 154.2 lb

## 2012-07-31 DIAGNOSIS — G479 Sleep disorder, unspecified: Secondary | ICD-10-CM

## 2012-07-31 DIAGNOSIS — F17200 Nicotine dependence, unspecified, uncomplicated: Secondary | ICD-10-CM

## 2012-07-31 DIAGNOSIS — F411 Generalized anxiety disorder: Secondary | ICD-10-CM | POA: Insufficient documentation

## 2012-07-31 DIAGNOSIS — G5761 Lesion of plantar nerve, right lower limb: Secondary | ICD-10-CM

## 2012-07-31 DIAGNOSIS — J309 Allergic rhinitis, unspecified: Secondary | ICD-10-CM

## 2012-07-31 DIAGNOSIS — G8929 Other chronic pain: Secondary | ICD-10-CM | POA: Insufficient documentation

## 2012-07-31 DIAGNOSIS — I671 Cerebral aneurysm, nonruptured: Secondary | ICD-10-CM | POA: Insufficient documentation

## 2012-07-31 DIAGNOSIS — M549 Dorsalgia, unspecified: Secondary | ICD-10-CM

## 2012-07-31 DIAGNOSIS — M545 Low back pain, unspecified: Secondary | ICD-10-CM

## 2012-07-31 DIAGNOSIS — E785 Hyperlipidemia, unspecified: Secondary | ICD-10-CM

## 2012-07-31 DIAGNOSIS — M8580 Other specified disorders of bone density and structure, unspecified site: Secondary | ICD-10-CM

## 2012-07-31 DIAGNOSIS — M899 Disorder of bone, unspecified: Secondary | ICD-10-CM

## 2012-07-31 DIAGNOSIS — G4700 Insomnia, unspecified: Secondary | ICD-10-CM

## 2012-07-31 DIAGNOSIS — B0229 Other postherpetic nervous system involvement: Secondary | ICD-10-CM

## 2012-07-31 DIAGNOSIS — K219 Gastro-esophageal reflux disease without esophagitis: Secondary | ICD-10-CM

## 2012-07-31 DIAGNOSIS — G5760 Lesion of plantar nerve, unspecified lower limb: Secondary | ICD-10-CM | POA: Insufficient documentation

## 2012-07-31 MED ORDER — MELOXICAM 15 MG PO TABS: 15.0000 mg | ORAL_TABLET | Freq: Every day | ORAL | Status: DC

## 2012-07-31 MED ORDER — FLUTICASONE PROPIONATE 50 MCG/ACT NA SUSP: 2.0000 | Freq: Every day | NASAL | Status: DC

## 2012-07-31 MED ORDER — RANITIDINE HCL 300 MG PO TABS: ORAL_TABLET | ORAL | Status: DC

## 2012-07-31 MED ORDER — CYCLOBENZAPRINE HCL 10 MG PO TABS: ORAL_TABLET | ORAL | Status: DC

## 2012-07-31 MED ORDER — GABAPENTIN 300 MG PO CAPS: 300.0000 mg | ORAL_CAPSULE | Freq: Two times a day (BID) | ORAL | Status: DC

## 2012-07-31 MED ORDER — TRIAZOLAM 0.25 MG PO TABS: ORAL_TABLET | ORAL | Status: DC

## 2012-07-31 MED ORDER — FLUOXETINE HCL 20 MG PO TABS: 20.0000 mg | ORAL_TABLET | Freq: Every day | ORAL | Status: DC

## 2012-07-31 MED ORDER — DIAZEPAM 5 MG PO TABS: 5.0000 mg | ORAL_TABLET | Freq: Three times a day (TID) | ORAL | Status: DC | PRN

## 2012-07-31 NOTE — Progress Notes (Signed)
  Subjective:    Patient ID: Sandra Waller, female    DOB: [birth year], 57 y.o.   MRN: [id number]  HPIhere for refills Patient Active Problem List   Diagnosis Date Noted  . Neuroma, Morton's---new dx podiatry-trying shots w/out succ//wants to try neurontin 07/31/2012  . Hyperlipidemia--reck per Fritz Pickerel at [facility] 12/24/2011  . Nicotine addiction--see last ov/better 12/24/2011  . Insomnia--halc wks 12/24/2011  . Osteopenia-fosamax 08/09/2009  . GAD stable-rare diazepam 11/03/2008        ICA aneurysm stable by M[facility] Dr Margarita Mail [facility]       Chronic LBP New-reflux after supper til bedtime/3 months/ok overnight/tea for supper/no new meds   Review of Systems Otherwise neg    Objective:   Physical Exam BP 106/72  Pulse 81  Temp(Src) 98.1 F (36.7 C) (Oral)  Resp 16  Ht 5' 2.5" (1.588 m)  Wt 154 lb 3.2 oz (69.945 kg)  BMI 27.74 kg/m2  SpO2 99% HEENT clear Ht reg extr no edema/R foot tender dorsum Neuro intact       Assessment & Plan:  Osteopenia  Hyperlipidemia  Nicotine addiction  Insomnia  PHN (postherpetic neuralgia) - Plan: gabapentin (NEURONTIN) 300 MG capsule  Sleep disturbance - Plan: diazepam (VALIUM) 5 MG tablet, triazolam (HALCION) 0.25 MG tablet, FLUoxetine (PROZAC) 20 MG tablet  Lumbar back pain - Plan: cyclobenzaprine (FLEXERIL) 10 MG tablet, meloxicam (MOBIC) 15 MG tablet  AR (allergic rhinitis) - Plan: fluticasone (FLONASE) 50 MCG/ACT nasal spray  Neuroma, Morton's, right  GAD (generalized anxiety disorder)  Aneurysm of internal carotid artery  Chronic back pain  GERD (gastroesophageal reflux disease)   Meds ordered this encounter  Medications  . calcium carbonate (OS-CAL) 600 MG TABS    Sig: Take 600 mg by mouth 2 (two) times daily with a meal.  . magnesium oxide (MAG-OX) 400 MG tablet    Sig: Take 400 mg by mouth daily.  . VOLTAREN 1 % GEL    Sig:   . gabapentin (NEURONTIN) 300 MG capsule    Sig: Take 1 capsule (300 mg total)  by mouth 2 (two) times daily.    Dispense:  68 capsule    Refill:  2  . diazepam (VALIUM) 5 MG tablet    Sig: Take 1 tablet (5 mg total) by mouth every 8 (eight) hours as needed.    Dispense:  90 tablet    Refill:  1  . cyclobenzaprine (FLEXERIL) 10 MG tablet    Sig: At bedtime    Dispense:  34 tablet    Refill:  5  . fluticasone (FLONASE) 50 MCG/ACT nasal spray    Sig: Place 2 sprays into the nose daily.    Dispense:  16 g    Refill:  11  . triazolam (HALCION) 0.25 MG tablet    Sig: Take 2 tablets (0.5 mg) at bedtime as needed    Dispense:  68 tablet    Refill:  5  . meloxicam (MOBIC) 15 MG tablet    Sig: Take 1 tablet (15 mg total) by mouth daily.    Dispense:  34 tablet    Refill:  5  . FLUoxetine (PROZAC) 20 MG tablet    Sig: Take 1 tablet (20 mg total) by mouth daily.    Dispense:  34 tablet    Refill:  5   F/u 6 mos Labs at gyn([place])

## 2012-08-12 ENCOUNTER — Encounter: Payer: Self-pay | Admitting: Women's Health

## 2013-01-15 ENCOUNTER — Ambulatory Visit (INDEPENDENT_AMBULATORY_CARE_PROVIDER_SITE_OTHER): Payer: 59 | Admitting: Internal Medicine

## 2013-01-15 ENCOUNTER — Encounter: Payer: Self-pay | Admitting: Internal Medicine

## 2013-01-15 ENCOUNTER — Other Ambulatory Visit: Payer: Self-pay | Admitting: Internal Medicine

## 2013-01-15 VITALS — BP 105/62 | HR 76 | Temp 98.4°F | Resp 18 | Wt 154.0 lb

## 2013-01-15 DIAGNOSIS — M8580 Other specified disorders of bone density and structure, unspecified site: Secondary | ICD-10-CM

## 2013-01-15 DIAGNOSIS — G479 Sleep disorder, unspecified: Secondary | ICD-10-CM

## 2013-01-15 DIAGNOSIS — M25569 Pain in unspecified knee: Secondary | ICD-10-CM

## 2013-01-15 DIAGNOSIS — M545 Low back pain, unspecified: Secondary | ICD-10-CM

## 2013-01-15 DIAGNOSIS — M899 Disorder of bone, unspecified: Secondary | ICD-10-CM

## 2013-01-15 DIAGNOSIS — M25519 Pain in unspecified shoulder: Secondary | ICD-10-CM

## 2013-01-15 DIAGNOSIS — M25512 Pain in left shoulder: Secondary | ICD-10-CM

## 2013-01-15 DIAGNOSIS — E785 Hyperlipidemia, unspecified: Secondary | ICD-10-CM

## 2013-01-15 DIAGNOSIS — M25559 Pain in unspecified hip: Secondary | ICD-10-CM

## 2013-01-15 DIAGNOSIS — M25552 Pain in left hip: Secondary | ICD-10-CM

## 2013-01-15 DIAGNOSIS — J309 Allergic rhinitis, unspecified: Secondary | ICD-10-CM

## 2013-01-15 DIAGNOSIS — M25562 Pain in left knee: Secondary | ICD-10-CM

## 2013-01-15 DIAGNOSIS — M949 Disorder of cartilage, unspecified: Secondary | ICD-10-CM

## 2013-01-15 DIAGNOSIS — F411 Generalized anxiety disorder: Secondary | ICD-10-CM

## 2013-01-15 DIAGNOSIS — F17200 Nicotine dependence, unspecified, uncomplicated: Secondary | ICD-10-CM

## 2013-01-15 DIAGNOSIS — G4700 Insomnia, unspecified: Secondary | ICD-10-CM

## 2013-01-15 LAB — COMPREHENSIVE METABOLIC PANEL
ALK PHOS: 86 U/L (ref 39–117)
ALT: 13 U/L (ref 0–35)
AST: 18 U/L (ref 0–37)
Albumin: 4 g/dL (ref 3.5–5.2)
BILIRUBIN TOTAL: 0.3 mg/dL (ref 0.3–1.2)
BUN: 19 mg/dL (ref 6–23)
CO2: 27 meq/L (ref 19–32)
CREATININE: 0.73 mg/dL (ref 0.50–1.10)
Calcium: 9.3 mg/dL (ref 8.4–10.5)
Chloride: 103 mEq/L (ref 96–112)
Glucose, Bld: 91 mg/dL (ref 70–99)
Potassium: 4.3 mEq/L (ref 3.5–5.3)
SODIUM: 137 meq/L (ref 135–145)
TOTAL PROTEIN: 6.3 g/dL (ref 6.0–8.3)

## 2013-01-15 LAB — LIPID PANEL
CHOL/HDL RATIO: 3.9 ratio
Cholesterol: 232 mg/dL — ABNORMAL HIGH (ref 0–200)
HDL: 59 mg/dL (ref >39)
LDL CALC: 144 mg/dL — AB (ref 0–99)
Triglycerides: 147 mg/dL (ref <150)
VLDL: 29 mg/dL (ref 0–40)

## 2013-01-15 LAB — CBC
HCT: 40.5 % (ref 36.0–46.0)
Hemoglobin: 14.2 g/dL (ref 12.0–15.0)
MCH: 32.3 pg (ref 26.0–34.0)
MCHC: 35.1 g/dL (ref 30.0–36.0)
MCV: 92.3 fL (ref 78.0–100.0)
PLATELETS: 258 10*3/uL (ref 150–400)
RBC: 4.39 MIL/uL (ref 3.87–5.11)
RDW: 13.2 % (ref 11.5–15.5)
WBC: 7 10*3/uL (ref 4.0–10.5)

## 2013-01-15 LAB — RHEUMATOID FACTOR

## 2013-01-15 MED ORDER — FLUOXETINE HCL 20 MG PO TABS: 20.0000 mg | ORAL_TABLET | Freq: Every day | ORAL | Status: DC

## 2013-01-15 MED ORDER — TRIAZOLAM 0.25 MG PO TABS: ORAL_TABLET | ORAL | Status: DC

## 2013-01-15 MED ORDER — DIAZEPAM 5 MG PO TABS: 5.0000 mg | ORAL_TABLET | Freq: Three times a day (TID) | ORAL | Status: DC | PRN

## 2013-01-15 MED ORDER — CYCLOBENZAPRINE HCL 10 MG PO TABS: ORAL_TABLET | ORAL | Status: DC

## 2013-01-15 MED ORDER — MELOXICAM 15 MG PO TABS: 15.0000 mg | ORAL_TABLET | Freq: Every day | ORAL | Status: DC

## 2013-01-15 MED ORDER — FLUTICASONE PROPIONATE 50 MCG/ACT NA SUSP: 2.0000 | Freq: Every day | NASAL | Status: DC

## 2013-01-15 NOTE — Progress Notes (Signed)
Subjective:    Patient ID: Sandra Waller, female    DOB: Dec 20, 1955, 58 y.o.   MRN: [id number]  HPI Patient presents today to discuss her medications, as well as her continued back pain with worsening hip and knee pain.  Patient Active Problem List   Diagnosis Date Noted  . Neuroma, Morton's 07/31/2012  . GAD (generalized anxiety disorder) 07/31/2012  . 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 Chronic back pain 07/31/2012  . Hyperlipidemia 12/24/2011  . Nicotine addiction 12/24/2011  . Insomnia 12/24/2011  . Osteopenia 08/09/2009  . ABDOMINAL PAIN, EPIGASTRIC 11/03/2008   Current outpatient prescriptions:cyclobenzaprine (FLEXERIL) 10 MG tablet, At bedtime, Disp: 34 tablet, Rfl: 5;  diazepam (VALIUM) 5 MG tablet, Take 1 tablet (5 mg total) by mouth every 8 (eight) hours as needed., Disp: 90 tablet, Rfl: 1;  FLUoxetine (PROZAC) 20 MG tablet, Take 1 tablet (20 mg total) by mouth daily., Disp: 34 tablet, Rfl: 5;  fluticasone (FLONASE) 50 MCG/ACT nasal spray, Place 2 sprays into both nostrils daily., Disp: 16 g, Rfl: 11 meloxicam (MOBIC) 15 MG tablet, Take 1 tablet (15 mg total) by mouth daily., Disp: 34 tablet, Rfl: 5;  Multiple Vitamins-Minerals (MULTIVITAMINS THER. W/MINERALS) TABS, Take 1 tablet by mouth daily., Disp: , Rfl: ;  OVER THE COUNTER MEDICATION, Take 1 scoop by mouth daily. Total Soy supplement shake, Disp: , Rfl: ;  ranitidine (ZANTAC) 300 MG tablet, 1 at supper, Disp: 34 tablet, Rfl: 5 triazolam (HALCION) 0.25 MG tablet, Take 2 tablets (0.5 mg) at bedtime as needed, Disp: 68 tablet, Rfl: 5   Feels like mood is good on current prozac dose. Uses valium 0-3 times a week as needed for anxiety. Requires halcion every night for sleep. Has tried to cut back to once a day, but is unable to fall asleep with decreased dose.  Patent had successful right foot surgery for plantar fasciitis and nerve damage in August, recovery has gone well, no longer requires medication for  pain. She has noticed increased arthritic pain in back, knees and hips over last month. No numbness, tingling or weakness. 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 Pain in the evening, briefly relieved by heat. Had left shoulder surgery several years ago, continues to have pain. Some relief with flexeril. 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 She notices increasing problems with pain in both hips and both knees while walking. She has continued problems with the posterior aspect of her left shoulder.   Review of Systems  Constitutional: Positive for activity change. Negative for fever, appetite change, fatigue and unexpected weight change.  Eyes: Negative for visual disturbance.  Respiratory: Negative for shortness of breath.   Cardiovascular: Negative for chest pain, palpitations and leg swelling.  Gastrointestinal: Negative for abdominal pain.  Genitourinary: Negative for difficulty urinating.  Neurological: Negative for headaches.  Psychiatric/Behavioral: Negative for behavioral problems.   No chest pain, no shortness of breath, no cough.     Objective:   Physical Exam  Nursing note and vitals reviewed. Constitutional: She is oriented to person, place, and time. She appears well-developed and well-nourished.  HENT:  Right Ear: Tympanic membrane, external ear and ear canal normal.  Left Ear: Tympanic membrane, external ear and ear canal normal.  Nose: Nose normal.  Mouth/Throat: Oropharynx is clear and moist.  Eyes: Conjunctivae are normal.  Neck: Normal range of motion. Neck supple.  Cardiovascular: Normal rate, regular rhythm and normal heart sounds.   Pulmonary/Chest: Effort normal and breath sounds normal.  Musculoskeletal: She exhibits no tenderness.  Left shoulder with tenderness along the posterior aspect of the upper scapula  that is made worse with forward flexion of the neck/examination of the shoulder shows good range of motion with no pain on abduction against resistance Hips have good range of external rotation with minimal tenderness/no swelling Knees have no effusion and are normal to exam Lumbosacral areas tender in the midline and to the right and left with palpation/straight leg raise to 90 discomfort bilaterally without radicular symptoms  Neurological: She is alert and oriented to person, place, and time. She has normal reflexes. No cranial nerve deficit.  Skin: Skin is warm and dry.  Psychiatric: She has a normal mood and affect. Her behavior is normal. Judgment and thought content normal.      Assessment & Plan:  Lumbar back pain - Plan: CBC, Comprehensive metabolic panel, cyclobenzaprine (FLEXERIL) 10 MG tablet, meloxicam (MOBIC) 15 MG tablet  It is time for orthopedic evaluation which might include all of her joints if the rheumatologic workup is negative GAD (generalized anxiety disorder) - Plan: CBC  Sleep disturbance - Plan: CBC, TSH, diazepam (VALIUM) 5 MG tablet, triazolam (HALCION) 0.25 MG tablet, FLUoxetine (PROZAC) 20 MG tablet  Hip pain, left - Plan: CBC, Rheumatoid factor, ANA  Knee pain, left - Plan: CBC, Rheumatoid factor, ANA  Generalized anxiety disorder - Plan: CBC  Insomnia, unspecified - Plan: CBC, TSH  Pain in joint, shoulder region, left - Plan: CBC, Comprehensive metabolic panel, Rheumatoid factor, ANA  This may be coming from impingement in the neck Other and unspecified hyperlipidemia - Plan: CBC, Comprehensive metabolic panel, Lipid panel  Osteopenia - Plan: CBC, Comprehensive metabolic panel, Lipid panel  AR (allergic rhinitis) - Plan: fluticasone (FLONASE) 50 MCG/ACT nasal spray

## 2013-01-16 LAB — ANA: ANA: NEGATIVE

## 2013-01-16 LAB — TSH: TSH: 0.208 u[IU]/mL — AB (ref 0.350–4.500)

## 2013-01-17 ENCOUNTER — Encounter: Payer: Self-pay | Admitting: Internal Medicine

## 2013-01-17 LAB — T4, FREE: Free T4: 0.98 ng/dL (ref 0.80–1.80)

## 2013-01-29 ENCOUNTER — Ambulatory Visit: Payer: 59 | Admitting: Internal Medicine

## 2013-02-05 ENCOUNTER — Encounter: Payer: Self-pay | Admitting: Internal Medicine

## 2013-02-05 ENCOUNTER — Ambulatory Visit: Payer: 59 | Admitting: Internal Medicine

## 2013-02-05 DIAGNOSIS — R7989 Other specified abnormal findings of blood chemistry: Secondary | ICD-10-CM | POA: Insufficient documentation

## 2013-02-14 ENCOUNTER — Other Ambulatory Visit: Payer: Self-pay | Admitting: Orthopaedic Surgery

## 2013-02-14 DIAGNOSIS — N281 Cyst of kidney, acquired: Secondary | ICD-10-CM

## 2013-02-19 ENCOUNTER — Ambulatory Visit
Admission: RE | Admit: 2013-02-19 | Discharge: 2013-02-19 | Disposition: A | Discharge status: Home / Self Care | Payer: 59 | Source: Ambulatory Visit | Attending: Orthopaedic Surgery | Admitting: Orthopaedic Surgery

## 2013-02-19 DIAGNOSIS — N281 Cyst of kidney, acquired: Secondary | ICD-10-CM

## 2013-05-14 IMAGING — CT CT TEMPORAL BONES W/ CM
3 of 5 series · 16 of 30 positions shown, 18 images · IV contrast (agent unspecified)
Comparison: Brain MRI/MRA/MRV 12/24/2010.]

CLINICAL DATA: 55-year-old female with hearing loss and pulsatile
tinnitus plus vertigo.

CT TEMPORAL BONES WITH CONTRAST
TECHNIQUE: Axial and coronal plane CT imaging of the petrous
temporal bones was performed with thin-collimation image
reconstruction after intravenous contrast administration.
Multiplanar CT image reconstructions were also generated.
Contrast:   75 ml [id number]-KJJ.

[Series 3: ax mag right · axial · 0.19mm/px · z∈[-2,+33]mm · 6 of 159 slices shown]
[im 23/159  bone]
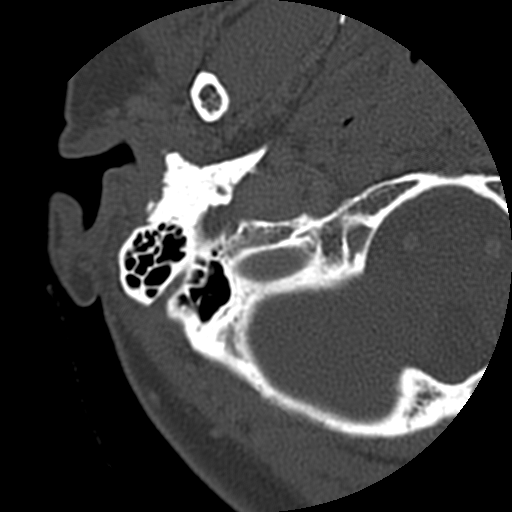
[im 46/159  bone]
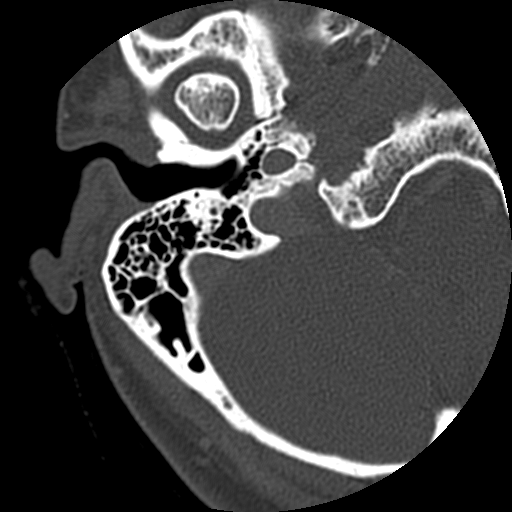
[im 68/159  bone]
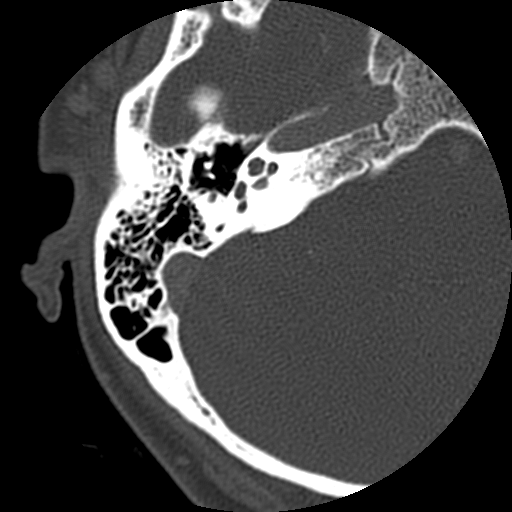
[im 91/159  bone]
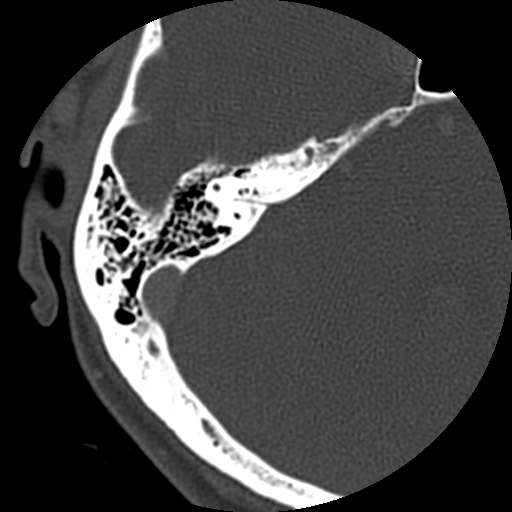
[im 113/159  bone]
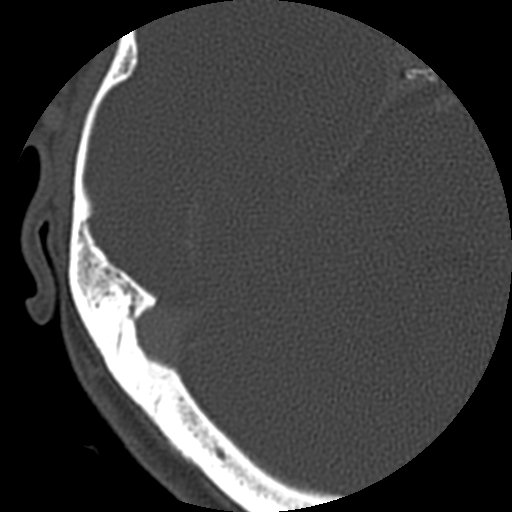
[im 136/159  bone]
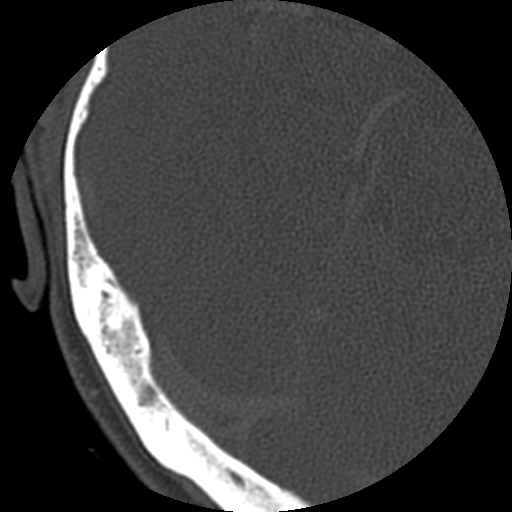

[Series 4: ax mag left · axial · 0.19mm/px · z∈[-3,+34]mm · 7 of 158 slices shown, 9 images]
[im 20/158  brain]
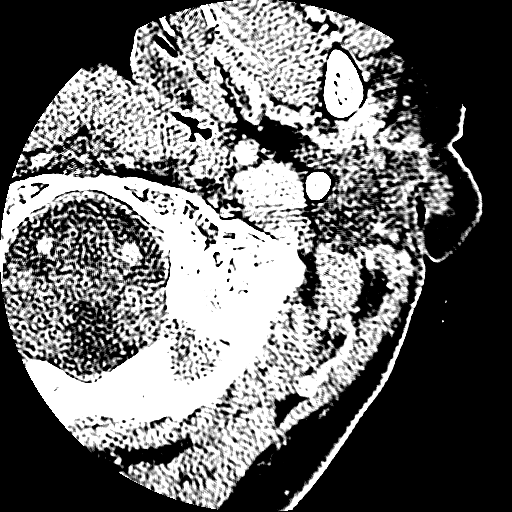
[im 20/158  bone]
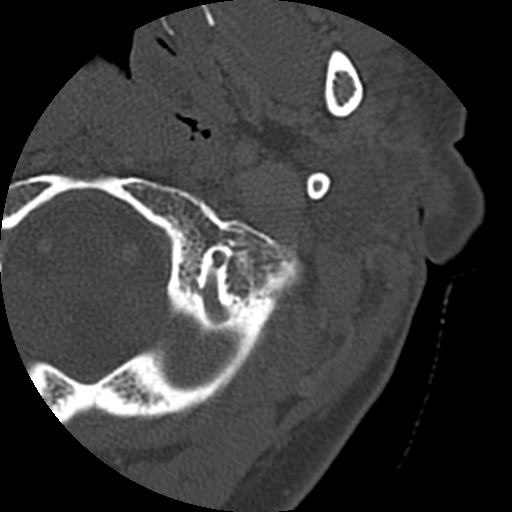
[im 40/158  bone]
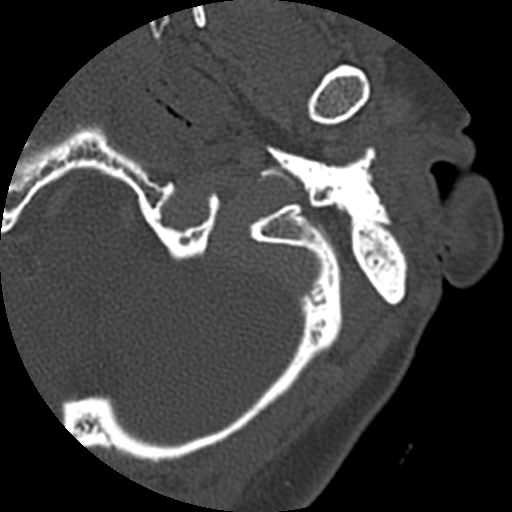
[im 59/158  bone]
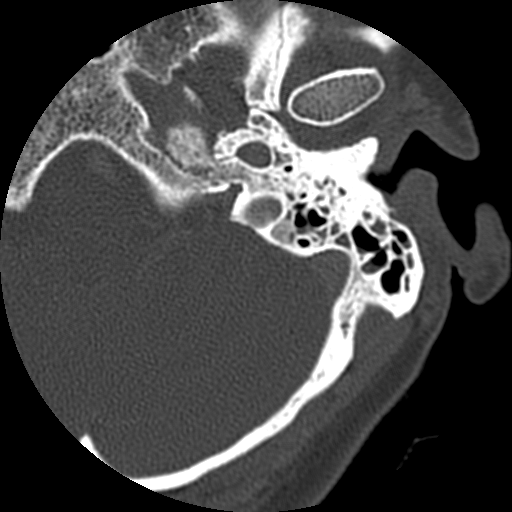
[im 79/158  bone]
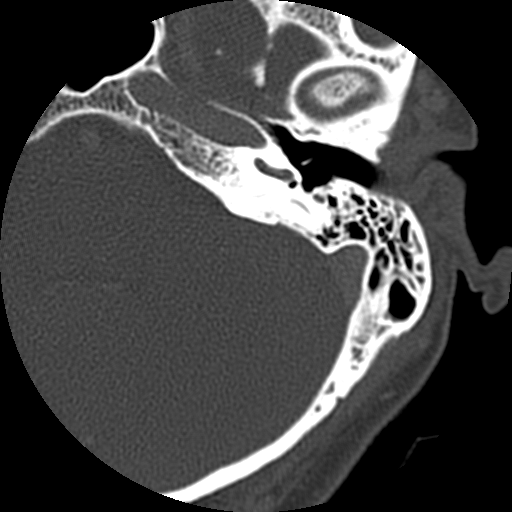
[im 99/158  brain]
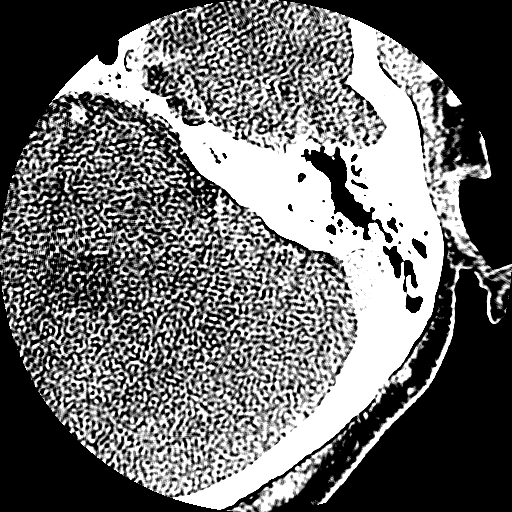
[im 99/158  bone]
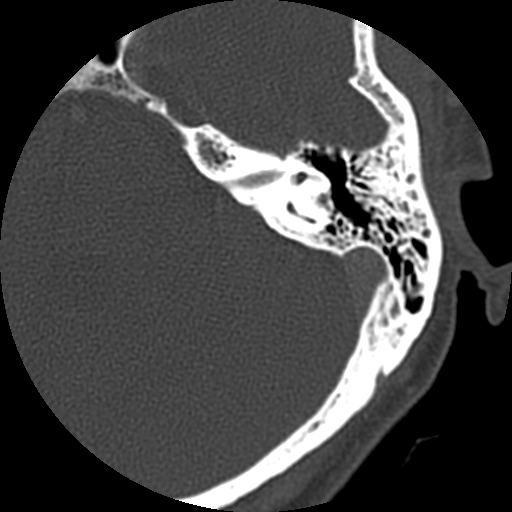
[im 118/158  bone]
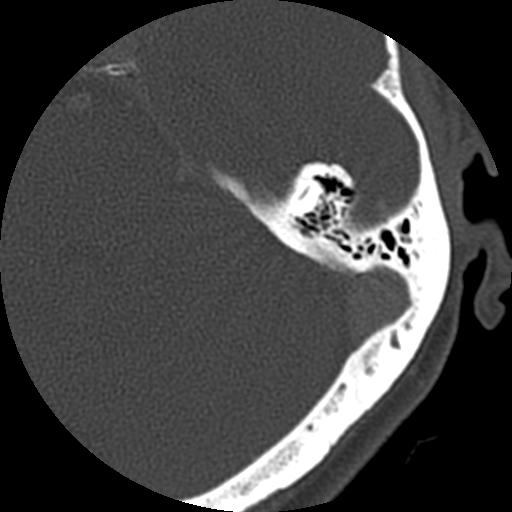
[im 138/158  bone]
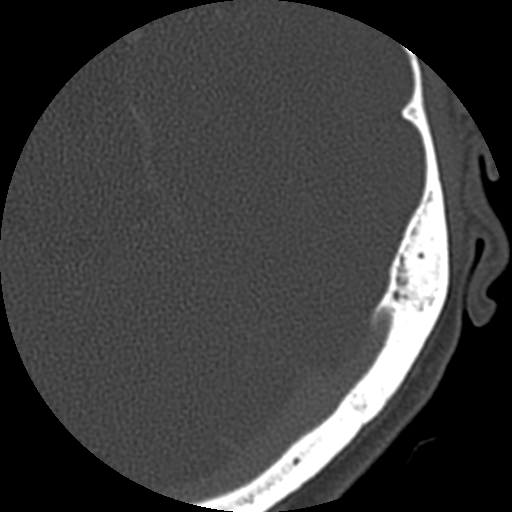

[Series 300: cor rt · coronal · 0.19mm/px · 3 of 100 slices shown]
[im 20/100  bone]
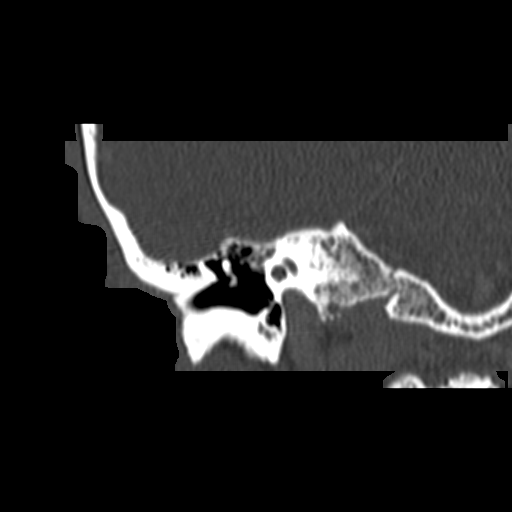
[im 40/100  bone]
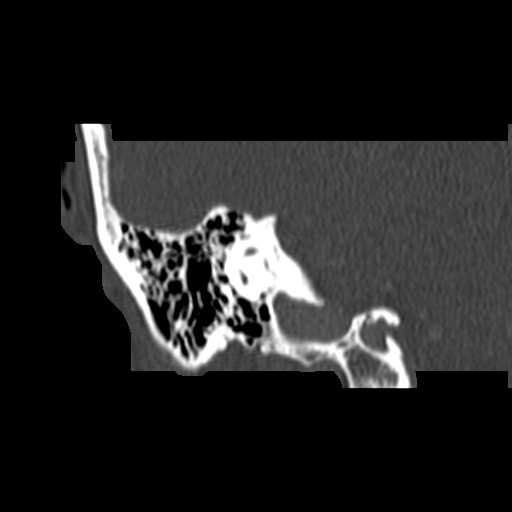
[im 60/100  bone]
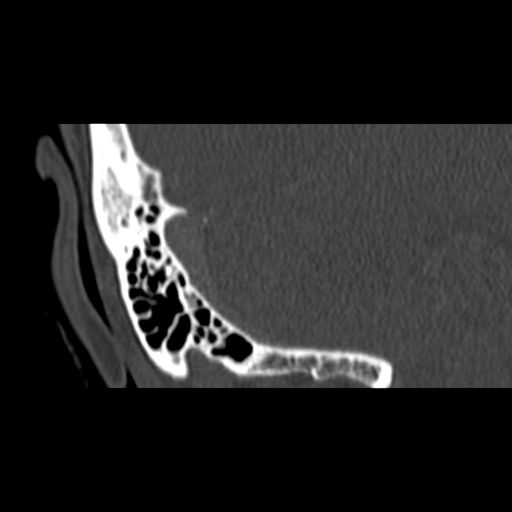

[16 of 30 positions shown; findings below may reference images not displayed]

FINDINGS: Negative visualized noncontrast brain parenchyma.
Cerebellopontine angles are within normal limits.  Visualized major
intracranial and extracranial vascular structures are patent
including the left sigmoid and transverse sinuses.

Visualized paranasal sinuses are clear.  Negative visualized deep
soft tissue spaces of the face.

Right temporal bone:  Normal external auditory canal, right
tympanic membrane, right tympanic cavity, ossicles and mastoids.
Normal right IAC, cochlea, vestibule and course of the right
seventh nerve.  The semicircular canals are at the upper limits of
normal to mildly enlarged.  The right vestibular aqueduct is within
normal limits.

Left temporal bone:  Normal external auditory canal, tympanic
membrane, tympanic cavity, and ossicles.  Scattered mild
opacification of the left mastoids does not appear significantly
changed from the previous MRI.  The aditus ad antrum and the
superior mastoids are normally pneumatized.  Normal left IAC,
cochlea, and course of the left seventh nerve.  The vestibule and
vestibular aqueduct are within normal limits.  The left side
semicircular canals are prominent similar those on the right.  No
convincing bony dehiscence.
IMPRESSION: 1.  Bilateral semicircular canals appear prominent, but otherwise
the bilateral internal auditory structures are within normal limits
by CT.
2.  Mild to moderate left mastoid inflammatory changes stable from
the MRI several weeks ago.  Favor simple mastoid effusion.

## 2013-05-28 ENCOUNTER — Encounter: Payer: Self-pay | Admitting: Internal Medicine

## 2013-05-28 ENCOUNTER — Ambulatory Visit (INDEPENDENT_AMBULATORY_CARE_PROVIDER_SITE_OTHER): Payer: 59 | Admitting: Internal Medicine

## 2013-05-28 VITALS — BP 94/60 | HR 81 | Temp 97.7°F | Resp 16 | Ht 62.0 in | Wt 153.0 lb

## 2013-05-28 DIAGNOSIS — F411 Generalized anxiety disorder: Secondary | ICD-10-CM

## 2013-05-28 DIAGNOSIS — G479 Sleep disorder, unspecified: Secondary | ICD-10-CM

## 2013-05-28 DIAGNOSIS — M899 Disorder of bone, unspecified: Secondary | ICD-10-CM

## 2013-05-28 DIAGNOSIS — M545 Low back pain, unspecified: Secondary | ICD-10-CM

## 2013-05-28 DIAGNOSIS — M8580 Other specified disorders of bone density and structure, unspecified site: Secondary | ICD-10-CM

## 2013-05-28 DIAGNOSIS — R946 Abnormal results of thyroid function studies: Secondary | ICD-10-CM

## 2013-05-28 DIAGNOSIS — Z0000 Encounter for general adult medical examination without abnormal findings: Secondary | ICD-10-CM

## 2013-05-28 DIAGNOSIS — E785 Hyperlipidemia, unspecified: Secondary | ICD-10-CM

## 2013-05-28 DIAGNOSIS — Z23 Encounter for immunization: Secondary | ICD-10-CM

## 2013-05-28 DIAGNOSIS — M949 Disorder of cartilage, unspecified: Secondary | ICD-10-CM

## 2013-05-28 DIAGNOSIS — M549 Dorsalgia, unspecified: Secondary | ICD-10-CM

## 2013-05-28 DIAGNOSIS — G4700 Insomnia, unspecified: Secondary | ICD-10-CM

## 2013-05-28 DIAGNOSIS — J309 Allergic rhinitis, unspecified: Secondary | ICD-10-CM

## 2013-05-28 DIAGNOSIS — F17200 Nicotine dependence, unspecified, uncomplicated: Secondary | ICD-10-CM

## 2013-05-28 DIAGNOSIS — R7989 Other specified abnormal findings of blood chemistry: Secondary | ICD-10-CM

## 2013-05-28 DIAGNOSIS — G8929 Other chronic pain: Secondary | ICD-10-CM

## 2013-05-28 LAB — CBC WITH DIFFERENTIAL/PLATELET
Basophils Absolute: 0.1 10*3/uL (ref 0.0–0.1)
Basophils Relative: 1 % (ref 0–1)
EOS ABS: 0.2 10*3/uL (ref 0.0–0.7)
EOS PCT: 2 % (ref 0–5)
HCT: 42 % (ref 36.0–46.0)
HEMOGLOBIN: 14.9 g/dL (ref 12.0–15.0)
LYMPHS ABS: 2.5 10*3/uL (ref 0.7–4.0)
Lymphocytes Relative: 31 % (ref 12–46)
MCH: 32.4 pg (ref 26.0–34.0)
MCHC: 35.5 g/dL (ref 30.0–36.0)
MCV: 91.3 fL (ref 78.0–100.0)
MONOS PCT: 7 % (ref 3–12)
Monocytes Absolute: 0.6 10*3/uL (ref 0.1–1.0)
Neutro Abs: 4.8 10*3/uL (ref 1.7–7.7)
Neutrophils Relative %: 59 % (ref 43–77)
Platelets: 288 10*3/uL (ref 150–400)
RBC: 4.6 MIL/uL (ref 3.87–5.11)
RDW: 13 % (ref 11.5–15.5)
WBC: 8.1 10*3/uL (ref 4.0–10.5)

## 2013-05-28 LAB — T3, FREE: T3, Free: 3.4 pg/mL (ref 2.3–4.2)

## 2013-05-28 LAB — TSH: TSH: 0.269 u[IU]/mL — ABNORMAL LOW (ref 0.350–4.500)

## 2013-05-28 LAB — T4, FREE: FREE T4: 0.94 ng/dL (ref 0.80–1.80)

## 2013-05-28 MED ORDER — MELOXICAM 15 MG PO TABS: 15.0000 mg | ORAL_TABLET | Freq: Every day | ORAL | Status: DC

## 2013-05-28 MED ORDER — FLUTICASONE PROPIONATE 50 MCG/ACT NA SUSP: 2.0000 | Freq: Every day | NASAL | Status: DC

## 2013-05-28 MED ORDER — TRIAZOLAM 0.25 MG PO TABS: ORAL_TABLET | ORAL | Status: DC

## 2013-05-28 MED ORDER — FLUOXETINE HCL 20 MG PO TABS: 20.0000 mg | ORAL_TABLET | Freq: Every day | ORAL | Status: DC

## 2013-05-28 MED ORDER — DIAZEPAM 5 MG PO TABS: 5.0000 mg | ORAL_TABLET | Freq: Three times a day (TID) | ORAL | Status: DC | PRN

## 2013-05-28 NOTE — Progress Notes (Addendum)
Subjective:  This chart was scribed for Sandra Siaobert Doolittle, MD by Sandra Waller, Scribe.  [facility] and the patient's care was started at 11:04 AM.   Patient ID: Sandra Waller, female    DOB: Dec 10, 1955, 58 y.o.   MRN: [id number]  HPI  HPI Comments: Sandra Waller is a 58 y.o. female who [facility] and medication refills.  She was recently sent to Dr. Cleophas DunkerWhitfield for her back and diagnosed with arthritis in her back. She was sent to Dr. Alvester MorinNewton who gave her 2 injections, which have provided some relief.  She may get another injection.  She has been concerned over her thyroid numbers which her orthopedist told her were abnormal.  She does complain of fatigue, unexpected weight gain, thin nails, and hair loss.  She denies staying cold.  She fell through a pool deck last year and was diagnosed with plantar fasciitis in her right foot.  She had tendon release surgery but continues to have pain on the bottom of her right foot.    She has been sleeping poorly for the past 3 nights.  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  She has been prescribed Halcion but has not been taking this.  She needs a refill.  She notes that she generally sleeps much better during the summer.  She snores at night but husband has not noticed any apnea.  She is concerned that she bruises easily.  However she can account for most of her bruises.  She is still smoking.  She is using a pedometer on her iPhone and states she walks at least 5 miles at work every day.  She needs a pneumonia shot.  Pt's sister was recently told that "the bottom of her heart's not pumping right."  Sister is overweight but is not a smoker.  Unknown whether she has high cholesterol.    Patient Active Problem List   Diagnosis Date Noted  . Low TSH level with low normal free T4-2014 02/05/2013  . Neuroma, Morton's 07/31/2012  . GAD (generalized  anxiety disorder) 07/31/2012  . 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 Chronic back pain 07/31/2012  . Hyperlipidemia 12/24/2011  . Nicotine addiction 12/24/2011  . Insomnia 12/24/2011  . Osteopenia 08/09/2009  . ABDOMINAL PAIN, EPIGASTRIC 11/03/2008    Past Medical History  Diagnosis Date  . Osteopenia 08/2009    T-score -2.4 AP spine  . Shingles 06/2011  . Anxiety   . Allergy     Past Surgical History  Procedure Laterality Date  . Lavh bso      OVARIAN CYSTS AND ENDOMETRIOSIS  . Abdominal hysterectomy      [facility]  . Nose surgery  2002  . Cysts on wrist removed  94  . Left shoudler surgery    . Cholecystectomy      Prior to Admission medications   Medication Sig Start Date End Date Taking? Authorizing Provider  diazepam (VALIUM) 5 MG tablet Take 1 tablet (5 mg total) by mouth every 8 (eight) hours as needed. 01/15/13  Yes Tonye Pearsonobert P Doolittle, MD  FLUoxetine (PROZAC) 20 MG tablet Take 1 tablet (20 mg total) by mouth daily. 01/15/13  Yes Tonye Pearsonobert P Doolittle, MD  fluticasone (FLONASE) 50 MCG/ACT nasal spray Place 2 sprays into both nostrils daily. 01/15/13  Yes Tonye Pearsonobert P Doolittle, MD  meloxicam (MOBIC) 15 MG tablet Take 1 tablet (15 mg total)  by mouth daily. 01/15/13  Yes Tonye Pearsonobert P Doolittle, MD  methocarbamol (ROBAXIN) 500 MG tablet Take 500 mg by mouth every 8 (eight) hours as needed for muscle spasms.   Yes Historical Provider, MD  Multiple Vitamins-Minerals (MULTIVITAMINS THER. W/MINERALS) TABS Take 1 tablet by mouth daily.   Yes Historical Provider, MD  OVER THE COUNTER MEDICATION Take 1 scoop by mouth daily. Total Soy supplement shake   Yes Historical Provider, MD  triazolam (HALCION) 0.25 MG tablet Take 2 tablets (0.5 mg) at bedtime as needed 01/15/13  Yes Tonye Pearsonobert P Doolittle, MD  cyclobenzaprine (FLEXERIL) 10 MG tablet At bedtime 01/15/13   Tonye Pearsonobert P Doolittle, MD  ranitidine (ZANTAC) 300 MG tablet 1 at supper 07/31/12   Tonye Pearsonobert P Doolittle, MD     Review of Systems    Constitutional: Positive for fatigue and unexpected weight change. Negative for chills.  Musculoskeletal: Positive for arthralgias and back pain (improved since received injections).  Hematological: Bruises/bleeds easily (concerned that she bruises easily, though she can account for most of her bruises).  Psychiatric/Behavioral: Positive for sleep disturbance (past few nights, likely due to stress).  remainder ros negative      Objective:   Physical Exam  Nursing note and vitals reviewed. Constitutional: She is oriented to person, place, and time. She appears well-developed and well-nourished. No distress.  HENT:  Head: Normocephalic and atraumatic.  Right Ear: External ear normal.  Left Ear: External ear normal.  Nose: Nose normal.  Mouth/Throat: Oropharynx is clear and moist.  Eyes: Conjunctivae and EOM are normal. Pupils are equal, round, and reactive to light.  Neck: Normal range of motion. Neck supple. No thyromegaly present.  Cardiovascular: Normal rate, regular rhythm and normal heart sounds.   No murmur heard. Pulmonary/Chest: Effort normal and breath sounds normal. No respiratory distress. She has no wheezes. She has no rales.  Abdominal: Soft. Bowel sounds are normal. She exhibits no distension and no mass. There is no tenderness. There is no rebound.  Musculoskeletal: Normal range of motion. She exhibits no edema and no tenderness.  Tenderness to palpation under the 2nd metatarsal on the right foot, without obvious swelling.  Lymphadenopathy:    She has no cervical adenopathy.  Neurological: She is alert and oriented to person, place, and time. She has normal reflexes. No cranial nerve deficit.  Skin: Skin is warm and dry.  Psychiatric: She has a normal mood and affect. Her behavior is normal. Judgment and thought content normal.     BP 94/60  Pulse 81  Temp(Src) 97.7 F (36.5 C) (Oral)  Resp 16  Ht 5\' 2"  (1.575 m)  Wt 153 lb (69.4 kg)  BMI 27.98 kg/m2  SpO2  97%   Visual Acuity Screening   Right eye Left eye Both eyes  Without correction:     With correction: 20/40 20/70 20/40         Assessment & Plan:  Annual physical exam - Plan: T4, free, T3, Free, TSH, CBC with Differential, COMPLETE METABOLIC PANEL WITH GFR, Lipid panel, Pneumococcal conjugate vaccine 13-valent IM, Vitamin D, 25-hydroxy  Osteopenia - Plan: Vitamin D, 25-hydroxy  Other and unspecified hyperlipidemia - Plan: Lipid panel  Nicotine addiction  Disc replacement strategies  Low TSH level with low normal free T4-2014  Reck  Insomnia  Halcion prn  Hyperlipidemia  Reck  GAD (generalized anxiety disorder)  Proz/valium  Chronic back pain  Dr whitfield  Lumbar back pain - Plan: meloxicam (MOBIC) 15 MG tablet  AR (allergic rhinitis) - Plan:  fluticasone (FLONASE) 50 MCG/ACT nasal spray  Foot pain-metatarsalgis R#2--  Pad or moleskin////orthotics only if fails  Meds ordered this encounter  Medications  . methocarbamol (ROBAXIN) 500 MG tablet    Sig: Take 500 mg by mouth every 8 (eight) hours as needed for muscle spasms.  . meloxicam (MOBIC) 15 MG tablet    Sig: Take 1 tablet (15 mg total) by mouth daily.    Dispense:  34 tablet    Refill:  5  . triazolam (HALCION) 0.25 MG tablet    Sig: Take 2 tablets (0.5 mg) at bedtime as needed    Dispense:  68 tablet    Refill:  5  . FLUoxetine (PROZAC) 20 MG tablet    Sig: Take 1 tablet (20 mg total) by mouth daily.    Dispense:  34 tablet    Refill:  5  . diazepam (VALIUM) 5 MG tablet    Sig: Take 1 tablet (5 mg total) by mouth every 8 (eight) hours as needed.    Dispense:  90 tablet    Refill:  1  . fluticasone (FLONASE) 50 MCG/ACT nasal spray    Sig: Place 2 sprays into both nostrils daily.    Dispense:  16 g    Refill:  11  [facility] [employer] wouldn't cover zostavax at pharm cause <62(Hx shingles twice)   I have completed the patient encounter in its entirety as documented by the scribe, with editing by me  where necessary. Robert P. Merla Riches, M.D.

## 2013-05-29 LAB — VITAMIN D 25 HYDROXY (VIT D DEFICIENCY, FRACTURES): Vit D, 25-Hydroxy: 44 ng/mL (ref 30–89)

## 2013-05-31 ENCOUNTER — Encounter: Payer: Self-pay | Admitting: Internal Medicine

## 2013-06-03 LAB — LIPID PANEL
Cholesterol: 221 mg/dL — ABNORMAL HIGH (ref 0–200)
HDL: 64 mg/dL (ref >39)
LDL Cholesterol: 140 mg/dL — ABNORMAL HIGH (ref 0–99)
TRIGLYCERIDES: 84 mg/dL (ref <150)
Total CHOL/HDL Ratio: 3.5 Ratio
VLDL: 17 mg/dL (ref 0–40)

## 2013-06-03 LAB — COMPLETE METABOLIC PANEL WITH GFR
ALT: 17 U/L (ref 0–35)
AST: 14 U/L (ref 0–37)
Albumin: 4.2 g/dL (ref 3.5–5.2)
Alkaline Phosphatase: 84 U/L (ref 39–117)
BILIRUBIN TOTAL: 0.6 mg/dL (ref 0.2–1.2)
BUN: 15 mg/dL (ref 6–23)
CALCIUM: 9.8 mg/dL (ref 8.4–10.5)
CO2: 25 meq/L (ref 19–32)
CREATININE: 0.8 mg/dL (ref 0.50–1.10)
Chloride: 105 mEq/L (ref 96–112)
GFR, Est Non African American: 83 mL/min
GLUCOSE: 84 mg/dL (ref 70–99)
Potassium: 4.3 mEq/L (ref 3.5–5.3)
Sodium: 138 mEq/L (ref 135–145)
Total Protein: 6.2 g/dL (ref 6.0–8.3)

## 2013-08-09 DIAGNOSIS — M8580 Other specified disorders of bone density and structure, unspecified site: Secondary | ICD-10-CM

## 2013-08-09 HISTORY — DX: Other specified disorders of bone density and structure, unspecified site: M85.80

## 2013-08-11 ENCOUNTER — Encounter: Payer: Self-pay | Admitting: Women's Health

## 2013-08-14 ENCOUNTER — Encounter: Payer: Self-pay | Admitting: Women's Health

## 2013-08-14 ENCOUNTER — Ambulatory Visit (INDEPENDENT_AMBULATORY_CARE_PROVIDER_SITE_OTHER): Payer: 59 | Admitting: Women's Health

## 2013-08-14 VITALS — BP 102/70 | Ht 61.75 in | Wt 155.0 lb

## 2013-08-14 DIAGNOSIS — Z01419 Encounter for gynecological examination (general) (routine) without abnormal findings: Secondary | ICD-10-CM

## 2013-08-14 DIAGNOSIS — M949 Disorder of cartilage, unspecified: Secondary | ICD-10-CM

## 2013-08-14 DIAGNOSIS — M899 Disorder of bone, unspecified: Secondary | ICD-10-CM

## 2013-08-14 DIAGNOSIS — M8580 Other specified disorders of bone density and structure, unspecified site: Secondary | ICD-10-CM

## 2013-08-14 MED ORDER — ALENDRONATE SODIUM 70 MG PO TABS: 70.0000 mg | ORAL_TABLET | ORAL | Status: DC

## 2013-08-14 NOTE — Progress Notes (Signed)
Zella BallRobin Wisnewski March 02, 1955 [id number]    History:    Presents for annual exam.  1993 TAH with BSO for cysts and endometriosis. 2013 DEXA T score -1.5 at right femoral neck, -1.8 at spine had been on Fosamax weekly until one year ago when she had a traumatic fall had foot surgery, missed annual exam and has been off the Fosamax now one year. Had been on approximately 3 years at that time. Normal Pap and mammogram history, had  ultrasound last week - normal. 2011 negative colonoscopy. History of shingles. Smoker not ready to quit. Labs primary care.  Past medical history, past surgical history, family history and social history were all reviewed and documented in the EPIC chart. 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 2 current sons both doing well.  ROS:  A  12 point ROS was performed and pertinent positives and negatives are included.  Exam:  Filed Vitals:   08/14/13 0753  BP: 102/70    General appearance:  Normal Thyroid:  Symmetrical, normal in size, without palpable masses or nodularity. Respiratory  Auscultation:  Clear without wheezing or rhonchi Cardiovascular  Auscultation:  Regular rate, without rubs, murmurs or gallops  Edema/varicosities:  Not grossly evident Abdominal  Soft,nontender, without masses, guarding or rebound.  Liver/spleen:  No organomegaly noted  Hernia:  None appreciated  Skin  Inspection:  Grossly normal   Breasts: Examined lying and sitting.     Right: Without masses, retractions, discharge or axillary adenopathy.     Left: Without masses, retractions, discharge or axillary adenopathy. Gentitourinary   Inguinal/mons:  Normal without inguinal adenopathy  External genitalia:  Normal  BUS/Urethra/Skene's glands:  Normal  Vagina:  Normal  Cervix: Absent  Uterus: Absent  Adnexa/parametria:     Rt: Without masses or tenderness.   Lt: Without masses or tenderness.  Anus and perineum: Normal  Digital rectal exam: Normal sphincter tone without  palpated masses or tenderness  Assessment/Plan:  58 y.o. MWF G2P2 for annual exam with no complaints. TVH with BSO on no HRT Osteopenia on Fosamax Anxiety/depression, hyperlipidemia-primary care manages labs and meds. Smoker-bradycardia to quit  Plan: Repeat DEXA, Fosamax 70 weekly prescription, proper use given and reviewed. Reviewed best to stay on for 5 years. SBE's, continue annual mammogram, calcium rich diet, vitamin D level normal continue calcium with vitamin D supplement. Regular exercise, importance of decreasing/ quitting smoking reviewed.  Note: This dictation was prepared with Dragon/digital dictation.  Any transcriptional errors that result are unintentional. Harrington ChallengerYOUNG,NANCY [facility] LLCWHNP, 8:21 AM 08/14/2013

## 2013-08-14 NOTE — Patient Instructions (Signed)
Health Recommendations for Postmenopausal Women Respected and ongoing research has looked at the most common causes of death, disability, and poor quality of life in postmenopausal women. The causes include heart disease, diseases of blood vessels, diabetes, depression, cancer, and bone loss (osteoporosis). Many things can be done to help lower the chances of developing these and other common problems. CARDIOVASCULAR DISEASE Heart Disease: A heart attack is a medical emergency. Know the signs and symptoms of a heart attack. Below are things women can do to reduce their risk for heart disease.   Do not smoke. If you smoke, quit.  Aim for a healthy weight. Being overweight causes many preventable deaths. Eat a healthy and balanced diet and drink an adequate amount of liquids.  Get moving. Make a commitment to be more physically active. Aim for 30 minutes of activity on most, if not all days of the week.  Eat for heart health. Choose a diet that is low in saturated fat and cholesterol and eliminate trans fat. Include whole grains, vegetables, and fruits. Read and understand the labels on food containers before buying.  Know your numbers. Ask your caregiver to check your blood pressure, cholesterol (total, HDL, LDL, triglycerides) and blood glucose. Work with your caregiver on improving your entire clinical picture.  High blood pressure. Limit or stop your table salt intake (try salt substitute and food seasonings). Avoid salty foods and drinks. Read labels on food containers before buying. Eating well and exercising can help control high blood pressure. STROKE  Stroke is a medical emergency. Stroke may be the result of a blood clot in a blood vessel in the brain or by a brain hemorrhage (bleeding). Know the signs and symptoms of a stroke. To lower the risk of developing a stroke:  Avoid fatty foods.  Quit smoking.  Control your diabetes, blood pressure, and irregular heart rate. THROMBOPHLEBITIS  (BLOOD CLOT) OF THE LEG  Becoming overweight and leading a stationary lifestyle may also contribute to developing blood clots. Controlling your diet and exercising will help lower the risk of developing blood clots. CANCER SCREENING  Breast Cancer: Take steps to reduce your risk of breast cancer.  You should practice "breast self-awareness." This means understanding the normal appearance and feel of your breasts and should include breast self-examination. Any changes detected, no matter how small, should be reported to your caregiver.  After age 40, you should have a clinical breast exam (CBE) every year.  Starting at age 40, you should consider having a mammogram (breast X-ray) every year.  If you have a family history of breast cancer, talk to your caregiver about genetic screening.  If you are at high risk for breast cancer, talk to your caregiver about having an MRI and a mammogram every year.  Intestinal or Stomach Cancer: Tests to consider are a rectal exam, fecal occult blood, sigmoidoscopy, and colonoscopy. Women who are high risk may need to be screened at an earlier age and more often.  Cervical Cancer:  Beginning at age 30, you should have a Pap test every 3 years as long as the past 3 Pap tests have been normal.  If you have had past treatment for cervical cancer or a condition that could lead to cancer, you need Pap tests and screening for cancer for at least 20 years after your treatment.  If you had a hysterectomy for a problem that was not cancer or a condition that could lead to cancer, then you no longer need Pap tests.    If you are between ages 65 and 70, and you have had normal Pap tests going back 10 years, you no longer need Pap tests.  If Pap tests have been discontinued, risk factors (such as a new sexual partner) need to be reassessed to determine if screening should be resumed.  Some medical problems can increase the chance of getting cervical cancer. In these  cases, your caregiver may recommend more frequent screening and Pap tests.  Uterine Cancer: If you have vaginal bleeding after reaching menopause, you should notify your caregiver.  Ovarian Cancer: Other than yearly pelvic exams, there are no reliable tests available to screen for ovarian cancer at this time except for yearly pelvic exams.  Lung Cancer: Yearly chest X-rays can detect lung cancer and should be done on high risk women, such as cigarette smokers and women with chronic lung disease (emphysema).  Skin Cancer: A complete body skin exam should be done at your yearly examination. Avoid overexposure to the sun and ultraviolet light lamps. Use a strong sun block cream when in the sun. All of these things are important for lowering the risk of skin cancer. MENOPAUSE Menopause Symptoms: Hormone therapy products are effective for treating symptoms associated with menopause:  Moderate to severe hot flashes.  Night sweats.  Mood swings.  Headaches.  Tiredness.  Loss of sex drive.  Insomnia.  Other symptoms. Hormone replacement carries certain risks, especially in older women. Women who use or are thinking about using estrogen or estrogen with progestin treatments should discuss that with their caregiver. Your caregiver will help you understand the benefits and risks. The ideal dose of hormone replacement therapy is not known. The Food and Drug Administration (FDA) has concluded that hormone therapy should be used only at the lowest doses and for the shortest amount of time to reach treatment goals.  OSTEOPOROSIS Protecting Against Bone Loss and Preventing Fracture If you use hormone therapy for prevention of bone loss (osteoporosis), the risks for bone loss must outweigh the risk of the therapy. Ask your caregiver about other medications known to be safe and effective for preventing bone loss and fractures. To guard against bone loss or fractures, the following is recommended:  If  you are younger than age 50, take 1000 mg of calcium and at least 600 mg of Vitamin D per day.  If you are older than age 50 but younger than age 70, take 1200 mg of calcium and at least 600 mg of Vitamin D per day.  If you are older than age 70, take 1200 mg of calcium and at least 800 mg of Vitamin D per day. Smoking and excessive alcohol intake increases the risk of osteoporosis. Eat foods rich in calcium and vitamin D and do weight bearing exercises several times a week as your caregiver suggests. DIABETES Diabetes Mellitus: If you have type I or type 2 diabetes, you should keep your blood sugar under control with diet, exercise, and recommended medication. Avoid starchy and fatty foods, and too many sweets. Being overweight can make diabetes control more difficult. COGNITION AND MEMORY Cognition and Memory: Menopausal hormone therapy is not recommended for the prevention of cognitive disorders such as Alzheimer's disease or memory loss.  DEPRESSION  Depression may occur at any age, but it is common in elderly women. This may be because of physical, medical, social (loneliness), or financial problems and needs. If you are experiencing depression because of medical problems and control of symptoms, talk to your caregiver about this. Physical   activity and exercise may help with mood and sleep. Community and volunteer involvement may improve your sense of value and worth. If you have depression and you feel that the problem is getting worse or becoming severe, talk to your caregiver about which treatment options are best for you. ACCIDENTS  Accidents are common and can be serious in elderly woman. Prepare your house to prevent accidents. Eliminate throw rugs, place hand bars in bath, shower, and toilet areas. Avoid wearing high heeled shoes or walking on wet, snowy, and icy areas. Limit or stop driving if you have vision or hearing problems, or if you feel you are unsteady with your movements and  reflexes. HEPATITIS C Hepatitis C is a type of viral infection affecting the liver. It is spread mainly through contact with blood from an infected person. It can be treated, but if left untreated, it can lead to severe liver damage over the years. Many people who are infected do not know that the virus is in their blood. If you are a "baby-boomer", it is recommended that you have one screening test for Hepatitis C. IMMUNIZATIONS  Several immunizations are important to consider having during your senior years, including:   Tetanus, diphtheria, and pertussis booster shot.  Influenza every year before the flu season begins.  Pneumonia vaccine.  Shingles vaccine.  Others, as indicated based on your specific needs. Talk to your caregiver about these. Document Released: 02/17/2005 Document Revised: 05/12/2013 Document Reviewed: 10/14/2007 [employer] Patient Information 2015 [employer]. This information is not intended to replace advice given to you by your health care provider. Make sure you discuss any questions you have with your health care provider.  

## 2013-08-20 ENCOUNTER — Other Ambulatory Visit: Payer: Self-pay | Admitting: Gynecology

## 2013-08-20 DIAGNOSIS — M8580 Other specified disorders of bone density and structure, unspecified site: Secondary | ICD-10-CM

## 2013-08-26 ENCOUNTER — Encounter: Payer: Self-pay | Admitting: Gynecology

## 2013-08-26 ENCOUNTER — Ambulatory Visit (INDEPENDENT_AMBULATORY_CARE_PROVIDER_SITE_OTHER): Payer: 59

## 2013-08-26 DIAGNOSIS — M949 Disorder of cartilage, unspecified: Secondary | ICD-10-CM

## 2013-08-26 DIAGNOSIS — M8580 Other specified disorders of bone density and structure, unspecified site: Secondary | ICD-10-CM

## 2013-08-26 DIAGNOSIS — M899 Disorder of bone, unspecified: Secondary | ICD-10-CM

## 2013-11-05 ENCOUNTER — Ambulatory Visit (INDEPENDENT_AMBULATORY_CARE_PROVIDER_SITE_OTHER): Payer: 59 | Admitting: Internal Medicine

## 2013-11-05 ENCOUNTER — Ambulatory Visit (INDEPENDENT_AMBULATORY_CARE_PROVIDER_SITE_OTHER): Payer: 59

## 2013-11-05 ENCOUNTER — Telehealth: Payer: Self-pay

## 2013-11-05 VITALS — BP 104/62 | HR 78 | Temp 97.7°F | Resp 16 | Ht 62.0 in | Wt 157.0 lb

## 2013-11-05 DIAGNOSIS — G479 Sleep disorder, unspecified: Secondary | ICD-10-CM

## 2013-11-05 DIAGNOSIS — M545 Low back pain: Secondary | ICD-10-CM

## 2013-11-05 DIAGNOSIS — F411 Generalized anxiety disorder: Secondary | ICD-10-CM

## 2013-11-05 DIAGNOSIS — M25571 Pain in right ankle and joints of right foot: Secondary | ICD-10-CM

## 2013-11-05 DIAGNOSIS — M7741 Metatarsalgia, right foot: Secondary | ICD-10-CM

## 2013-11-05 MED ORDER — MELOXICAM 15 MG PO TABS: 15.0000 mg | ORAL_TABLET | Freq: Every day | ORAL | Status: DC

## 2013-11-05 MED ORDER — DIAZEPAM 5 MG PO TABS: 5.0000 mg | ORAL_TABLET | Freq: Three times a day (TID) | ORAL | Status: DC | PRN

## 2013-11-05 MED ORDER — TRIAZOLAM 0.25 MG PO TABS: ORAL_TABLET | ORAL | Status: DC

## 2013-11-05 MED ORDER — FLUOXETINE HCL 20 MG PO TABS: 20.0000 mg | ORAL_TABLET | Freq: Every day | ORAL | Status: DC

## 2013-11-05 NOTE — Progress Notes (Addendum)
Subjective:  This chart was scribed for Tonye Pearsonobert P Doolittle, MD by Charline BillsEssence Howell, ED Scribe. The patient was seen in room 26. Patient's care was started at 12:10 PM.   Patient ID: Sandra Waller, female    DOB: [birth year], [age] y.o.   MRN: [id number]  Chief Complaint  Patient presents with  . Foot Pain    right foot, chronic  . Cough    since last weekend  . Nasal Congestion   HPI HPI Comments: Sandra FillersRobin Dellis is a 58 y.o. female, with a h/o chronic foot pain, who presents to the [facility] complaining of constant, gradually worsening R foot pain worsened over the past few days. Pt states that she was squatting a few days ago when she noted a sharp, shooting pain radiating towards her toes starting Mid R 4th MT near site of prior surgery. Pain is exacerbated with walking and pressing. She states that she is also continues plantar fascitis pain although she reports having surgery done by Dr. Theodosia PalingPetry. Work-on feet at least 6hrs. No swelling. Better with one shoe w/ fitted orthotic.  Prozac  Pt has noticed that Prozac is not as effective as it initially was. She requests an increase in dosage at this time. Mood not as bright/stable.  Cough Pt reports cough with associated congestion onset 4 days ago. She has tried Zinc tablets with mild relief.   Pt has not retired yet although she wishes to. She crochets as a hobby. Looking for new things to do.  Past Medical History  Diagnosis Date  . Osteopenia 08/2013    T score -2.0. Improved from -2.4  . Shingles 06/2011  . Anxiety   . Allergy   . Arthritis     BACK   Current Outpatient Prescriptions on File Prior to Visit  Medication Sig Dispense Refill  . alendronate (FOSAMAX) 70 MG tablet Take 1 tablet (70 mg total) by mouth once a week. Take with a full glass of water on an empty stomach.  12 tablet  4  . cyclobenzaprine (FLEXERIL) 10 MG tablet At bedtime  34 tablet  5  . diazepam (VALIUM) 5 MG tablet Take 1 tablet  (5 mg total) by mouth every 8 (eight) hours as needed.  90 tablet  1  . FLUoxetine (PROZAC) 20 MG tablet Take 1 tablet (20 mg total) by mouth daily.  34 tablet  5  . fluticasone (FLONASE) 50 MCG/ACT nasal spray Place 2 sprays into both nostrils daily.  16 g  11  . meloxicam (MOBIC) 15 MG tablet Take 1 tablet (15 mg total) by mouth daily.  34 tablet  5  . Multiple Vitamins-Minerals (MULTIVITAMINS THER. W/MINERALS) TABS Take 1 tablet by mouth daily.      Marland Kitchen. OVER THE COUNTER MEDICATION Take 1 scoop by mouth daily. Total Soy supplement shake      . triazolam (HALCION) 0.25 MG tablet Take 2 tablets (0.5 mg) at bedtime as needed  68 tablet  5   No current facility-administered medications on file prior to visit.   Allergies  Allergen Reactions  . Celebrex [Celecoxib] Nausea Only  . Z-Pak [Azithromycin] Nausea Only  . Adhesive [Tape] Rash   Review of Systems  Constitutional: Negative for fever, chills and fatigue.  HENT: Positive for congestion.   Respiratory: Positive for cough.   Musculoskeletal: Positive for myalgias.  still smoking w/ no intent to quit    Objective:   Physical Exam  Nursing note and vitals reviewed. Constitutional:  She is oriented to person, place, and time. She appears well-developed and well-nourished. No distress.  HENT:  Head: Normocephalic and atraumatic.  Eyes: Conjunctivae and EOM are normal.  Neck: Neck supple.  Pulmonary/Chest: Effort normal. No respiratory distress.  Musculoskeletal: Normal range of motion.  R foot: Not swollen Very tender mid shaft #4 MT  Tender mid shaft 2 and 5 Scar over 4th MT from neuroma removal   Neurological: She is alert and oriented to person, place, and time.  Skin: Skin is warm and dry.  Psychiatric: She has a normal mood and affect. Her behavior is normal.   Triage Vitals: BP 104/62  Pulse 78  Temp(Src) 97.7 F (36.5 C)  Resp 16  Ht 5\' 2"  (1.575 m)  Wt 157 lb (71.215 kg)  BMI 28.71 kg/m2  SpO2 99%  [facility] reading  (PRIMARY) by  Dr. Josephina Gipoolittle=no fx seen in MT areas      Assessment & Plan:   I personally performed the services described in this documentation, which was scribed in my presence. The recorded information has been reviewed and is accurate.  Pain in joint, ankle and foot, right - Plan: DG Foot Complete Right, Ambulatory referral to Podiatry  Metatarsalgia of right foot - Plan: Ambulatory referral to Podiatry  Sleep disturbance - Plan: triazolam (HALCION) 0.25 MG tablet, diazepam (VALIUM) 5 MG tablet, FLUoxetine (PROZAC) 20 MG tablet  Low back pain without sciatica, unspecified back pain laterality - Plan: meloxicam (MOBIC) 15 MG tablet  GAD (generalized anxiety disorder)  smoking  Ref to : Dr. Samara DeistKathryn Egerton-[facility]  Meds ordered this encounter  Medications  . TURMERIC CURCUMIN PO    Sig: Take 500 mg by mouth.  . triazolam (HALCION) 0.25 MG tablet    Sig: Take 2 tablets (0.5 mg) at bedtime as needed    Dispense:  68 tablet    Refill:  5  . diazepam (VALIUM) 5 MG tablet    Sig: Take 1 tablet (5 mg total) by mouth every 8 (eight) hours as needed.    Dispense:  90 tablet    Refill:  1  . FLUoxetine (PROZAC) 20 MG tablet    Sig: Take 1 tablet (20 mg total) by mouth daily.    Dispense:  34 tablet    Refill:  5  . meloxicam (MOBIC) 15 MG tablet    Sig: Take 1 tablet (15 mg total) by mouth daily.    Dispense:  34 tablet    Refill:  5

## 2013-11-05 NOTE — Telephone Encounter (Signed)
Message copied by Johnnette LitterARDWELL, Trashaun Streight M on Wed Nov 05, 2013  6:46 PM ------      Message from: Tonye PearsonOLITTLE, ROBERT P      Created: Wed Nov 05, 2013  5:23 PM       need to phone in halcion and valium ------

## 2013-11-05 NOTE — Telephone Encounter (Signed)
Both called in

## 2013-11-10 ENCOUNTER — Encounter: Payer: Self-pay | Admitting: Gynecology

## 2013-11-14 ENCOUNTER — Ambulatory Visit (INDEPENDENT_AMBULATORY_CARE_PROVIDER_SITE_OTHER): Payer: 59 | Admitting: Podiatrist

## 2013-11-14 ENCOUNTER — Encounter: Payer: Self-pay | Admitting: Podiatrist

## 2013-11-14 ENCOUNTER — Ambulatory Visit (INDEPENDENT_AMBULATORY_CARE_PROVIDER_SITE_OTHER): Payer: 59

## 2013-11-14 VITALS — BP 104/61 | HR 76 | Resp 16

## 2013-11-14 DIAGNOSIS — M779 Enthesopathy, unspecified: Secondary | ICD-10-CM

## 2013-11-14 NOTE — Progress Notes (Signed)
   Subjective:    Patient ID: Sandra Waller, female    DOB: 09/20/1955, 58 y.o.   MRN: [id number]  HPI Comments: "I have pain in this right foot"  Patient c/o aching, numbness plantar forefoot and 2-4 toes right for several months. She has had neuroma surgery by Dr. Elvin SoPetery last year. Never got better. The 2nd and 3rd toes are splitting apart. She had cortisone injections before she was scheduled for surgery and they were extremely painful. She takes meloxicam daily for arthritis.   Foot Pain Associated symptoms include arthralgias.      Review of Systems  HENT: Positive for sinus pressure.   Musculoskeletal: Positive for arthralgias and gait problem.  All other systems reviewed and are negative.      Objective:   Physical Exam Patient is awake, alert, and oriented x 3.  In no acute distress.  Vascular status is intact with palpable pedal pulses at 2/4 DP and PT bilateral and capillary refill time within normal limits. Neurological sensation is also intact bilaterally via Semmes Weinstein monofilament at 5/5 sites. Light touch, vibratory sensation, Achilles tendon reflex is intact. Dermatological exam reveals skin color, turger and texture as normal. Incisional scar from previous neuroma procedure at the third interapace of the right foot is noted.  Otherwise  Musculature intact with dorsiflexion, plantarflexion, inversion, eversion.  Pain submet 2 of the right foot is noted.  Pain and discomfort along the entire forefoot is noted.  Generalized discomfort along the incision site is noted. No palpable click is noted.Medially deviated second toe is also present. xrays show a long second metatarsal in comparison with first and third and a medially deviated second toe.      Assessment & Plan:  question stump neuroma right foot  Plan: We are going to get an MRI to determine the etiology of the pain on the right forefoot. Possibly the second metatarsal is too long causing excess pressure  however I would like to confirm that there is nothing else causing her pain prior to condition prior to considering surgery.  I will follow up with her with the result of the MRI.

## 2013-11-20 ENCOUNTER — Ambulatory Visit
Admission: RE | Admit: 2013-11-20 | Discharge: 2013-11-20 | Disposition: A | Discharge status: Home / Self Care | Payer: 59 | Source: Ambulatory Visit | Attending: Podiatrist | Admitting: Podiatrist

## 2013-11-20 DIAGNOSIS — M779 Enthesopathy, unspecified: Secondary | ICD-10-CM

## 2013-11-21 ENCOUNTER — Telehealth: Payer: Self-pay

## 2013-11-21 DIAGNOSIS — G479 Sleep disorder, unspecified: Secondary | ICD-10-CM

## 2013-11-21 NOTE — Telephone Encounter (Signed)
2 x 20 qd

## 2013-11-21 NOTE — Telephone Encounter (Signed)
Dr Merla Richesoolittle, pharm sent fax stating that pt says her prozac was supposed to be for 2 tabs QD now. I do see that at last OV pt reported that it was not as effective and you planned to increase. Did you want to Rx 20 mg, 2QD, or 40 mg tabs, 1QD?

## 2013-11-22 MED ORDER — FLUOXETINE HCL 20 MG PO TABS: 40.0000 mg | ORAL_TABLET | Freq: Every day | ORAL | Status: DC

## 2013-11-22 NOTE — Telephone Encounter (Signed)
rx sent in for 2 x 20 qd

## 2013-11-24 ENCOUNTER — Telehealth: Payer: Self-pay | Admitting: *Deleted

## 2013-11-24 NOTE — Telephone Encounter (Signed)
-----   Message from Delories HeinzKathryn P Egerton, DPM sent at 11/20/2013  4:57 PM EST ----- Regarding: mri report Please call Sandra Waller and tell her the MRI showed no fracture or neuroma, however there is inflammation at the joint where she has pain.  I would recommend a short air fracture walker for 3-4 weeks to try and calm down the inflammation.    Does she have a boot already?  (not the surgical shoe type)  Thanks! ----- Message -----    From: Rad Results In Interface    Sent: 11/20/2013   4:13 PM      To: Delories HeinzKathryn P Egerton, DPM

## 2013-11-24 NOTE — Telephone Encounter (Signed)
I called and informed her that Dr. Irving ShowsEgerton said there is no fractures nor a neuroma.  There is inflammation at the joint where you have the pain.  She said she would like you to wear the air fracture walker for 3-4 weeks to calm down the inflammation.  "I have a boot.  Does she want to see me back after I wear the boot?"  I told her yes.  I transferred her to a scheduler.

## 2013-12-17 ENCOUNTER — Ambulatory Visit (INDEPENDENT_AMBULATORY_CARE_PROVIDER_SITE_OTHER): Payer: 59 | Admitting: Podiatrist

## 2013-12-17 ENCOUNTER — Encounter: Payer: Self-pay | Admitting: Podiatrist

## 2013-12-17 VITALS — BP 124/70 | HR 70 | Resp 12

## 2013-12-17 DIAGNOSIS — M216X1 Other acquired deformities of right foot: Secondary | ICD-10-CM

## 2013-12-17 NOTE — Progress Notes (Deleted)
   Subjective:    Patient ID: Sandra Waller, female    DOB: 03/13/1955, 58 y.o.   MRN: [id number]  HPI Comments: "I have pain in this right foot"  Patient c/o aching, numbness plantar forefoot and 2-4 toes right for several months. She has had neuroma surgery by Dr. Petery last year. Never got better. The 2nd and 3rd toes are splitting apart. She had cortisone injections before she was scheduled for surgery and they were extremely painful. She takes meloxicam daily for arthritis.   Foot Pain Associated symptoms include arthralgias.      Review of Systems  HENT: Positive for sinus pressure.   Musculoskeletal: Positive for arthralgias and gait problem.  All other systems reviewed and are negative.      Objective:   Physical Exam Patient is awake, alert, and oriented x 3.  In no acute distress.  Vascular status is intact with palpable pedal pulses at 2/4 DP and PT bilateral and capillary refill time within normal limits. Neurological sensation is also intact bilaterally via Semmes Weinstein monofilament at 5/5 sites. Light touch, vibratory sensation, Achilles tendon reflex is intact. Dermatological exam reveals skin color, turger and texture as normal. Incisional scar from previous neuroma procedure at the third interapace of the right foot is noted.  Otherwise  Musculature intact with dorsiflexion, plantarflexion, inversion, eversion.  Pain submet 2 of the right foot is noted.  Pain and discomfort along the entire forefoot is noted.  Generalized discomfort along the incision site is noted. No palpable click is noted.Medially deviated second toe is also present. xrays show a long second metatarsal in comparison with first and third and a medially deviated second toe.      Assessment & Plan:  question stump neuroma right foot  Plan: We are going to get an MRI to determine the etiology of the pain on the right forefoot. Possibly the second metatarsal is too long causing excess pressure  however I would like to confirm that there is nothing else causing her pain prior to condition prior to considering surgery.  I will follow up with her with the result of the MRI. 

## 2013-12-18 ENCOUNTER — Telehealth: Payer: Self-pay | Admitting: *Deleted

## 2013-12-18 NOTE — Telephone Encounter (Signed)
"  I was there yesterday and was told to contact you to schedule surgery.  What's the earliest I can schedule?  I'd like to do it before Christmas."  I offered her 12/31/13.  "That will work for me.  Is there anything else I need to do?"  I asked if she received the surgery pamphlet.  She stated no.  I told her I would send her one in the mail.  I told her she will have to follow the instructions and go on-line to fill out the forms for the surgical center.  The surgical center will call you a day or 2 prior to your surgery date and they will give you your arrival time.  "Okay, thanks so much!"  [facility] to her.

## 2013-12-25 NOTE — Progress Notes (Signed)
Subjective:  Patient presents for follow-up of MRI on the right foot. The MRI showed no sign of neuroma or stump neuroma. She continues to have pain between the second and third metatarsal as well as on the second metatarsal of the right foot. Dr. Grandville SilosPetrie in the past performed her neurectomy.  Objective: Neurovascular status is unchanged. Continued pain at the second metatarsal head and between the second and third metatarsals is noted clinically. Radiographically a long second metatarsal is also noted which in the presence of negative MRI is likely pathologic and painful for her.  Assessment: Prominent and second metatarsal pain right foot, previous neurectomy right foot  Plan: Discussed the option of surgery to shorten the second metatarsal and decrease the biomechanical forces in this area of her foot. She's tried conservative treatment which of all failed and at this time is interested in surgery. The risks, benefits, alternatives to surgery were explained and 3 pages of the consent form were signed. Patient's questions were answered to the best of my ability. She will follow-up in the surgery Center at her convenience.

## 2013-12-31 DIAGNOSIS — M21542 Acquired clubfoot, left foot: Secondary | ICD-10-CM

## 2014-01-07 ENCOUNTER — Ambulatory Visit (INDEPENDENT_AMBULATORY_CARE_PROVIDER_SITE_OTHER): Payer: 59 | Admitting: Podiatrist

## 2014-01-07 ENCOUNTER — Ambulatory Visit (INDEPENDENT_AMBULATORY_CARE_PROVIDER_SITE_OTHER): Payer: 59

## 2014-01-07 ENCOUNTER — Encounter: Payer: Self-pay | Admitting: Podiatrist

## 2014-01-07 VITALS — BP 108/68 | HR 60 | Resp 12

## 2014-01-07 DIAGNOSIS — M216X1 Other acquired deformities of right foot: Secondary | ICD-10-CM

## 2014-01-07 DIAGNOSIS — Z9889 Other specified postprocedural states: Secondary | ICD-10-CM

## 2014-01-08 NOTE — Progress Notes (Signed)
Subjective: Patient presents today1 week status post foot surgery of the right foot.  Date of surgery 12/31/13-  metatarsal osteotomy of the second right foot was performed. Patient denies nausea, vomiting, fevers, chills or night sweats.  Denies calf pain or tenderness to the operative side.  Objective:  Neurovascular status is intact with palpable pedal pulses DP and PT bilateral at 2+ out of 4. Neurological sensation is intact and unchanged as per prior to surgery. Excellent appearance of the postoperative foot is noted. Incision line is well coapted with sutures in place. No redness, no swelling, no drainage, no sign of infection is present. No pain with medial to lateral calf compression is noted.  Assessment: Status post Weil osteotomy second metatarsal right foot  Plan:  Redressed the foot and a dry and sterile compressive dressing. The incision site looks excellent and therefore she can go ahead and get it wet. She is to redressed the foot with a dressing after this and keep compression on the foot with an Ace bandage. A Darco she was also dispensed and she can start to wear this next week. I'll see her back for recheck.

## 2014-01-20 ENCOUNTER — Ambulatory Visit (INDEPENDENT_AMBULATORY_CARE_PROVIDER_SITE_OTHER): Payer: 59 | Admitting: Podiatry

## 2014-01-20 ENCOUNTER — Ambulatory Visit (INDEPENDENT_AMBULATORY_CARE_PROVIDER_SITE_OTHER): Payer: 59

## 2014-01-20 VITALS — BP 104/66 | HR 90 | Resp 16

## 2014-01-20 DIAGNOSIS — S92302A Fracture of unspecified metatarsal bone(s), left foot, initial encounter for closed fracture: Secondary | ICD-10-CM

## 2014-01-20 DIAGNOSIS — S99922A Unspecified injury of left foot, initial encounter: Secondary | ICD-10-CM

## 2014-01-20 NOTE — Progress Notes (Signed)
She presents today after having misstepped resulting in left foot pain. She presents today in to slip on shoes either of which are surgical shoes. She denies any changes in her past medical history medications or allergies. States that her surgical foot right feels fine.  Objective: Vital signs are stable she is alert and oriented 3. Right foot appears to be normal incisions on the heal uneventfully. Left foot does demonstrate moderate edema with ecchymosis overlying the fifth metatarsal. Pain on palpation no angular deformity of the foot. Radiographic evaluation does demonstrate a comminuted diaphyseal fracture fifth metatarsal left foot. Minimal angular deformity on the 3 radiographs.  Assessment: Fifth metatarsal fracture left.  Plan: I feel that this fracture should go on to heal as long as she is stabilized in a cam walker I expressed to her the necessity of the use of the cam walker at all times. She will follow up with Dr. Brynda RimEdgington in 2 weeks at which time both feet will have radiographs performed. She was dispensed a Darco shoe to wear on the right foot and a compression anklet for the left foot. When she returns to [facility] left foot and wear it at all times.

## 2014-01-23 ENCOUNTER — Encounter: Payer: Self-pay | Admitting: Podiatrist

## 2014-01-23 ENCOUNTER — Encounter: Payer: 59 | Admitting: Podiatrist

## 2014-01-27 ENCOUNTER — Telehealth: Payer: Self-pay | Admitting: *Deleted

## 2014-01-27 NOTE — Telephone Encounter (Signed)
Sandra Waller came by and got chart notes, demographics and insurance information.

## 2014-01-27 NOTE — Telephone Encounter (Signed)
-----   Message from Delories HeinzKathryn P Egerton, DPM sent at 01/22/2014 10:46 AM EST ----- Regarding: bone stim Can you call Gillie Mannersrey Stewart and see if Tempest's insurance would cover a bone stim for a fresh fracture (she's a smoker too)  Thanks!!

## 2014-01-27 NOTE — Telephone Encounter (Signed)
I called and left a message for Thurston Poundsrey to call me back about getting a bone stimulator for the patient per Dr. Irving ShowsEgerton.

## 2014-02-04 ENCOUNTER — Encounter: Payer: Self-pay | Admitting: Podiatrist

## 2014-02-04 ENCOUNTER — Ambulatory Visit (INDEPENDENT_AMBULATORY_CARE_PROVIDER_SITE_OTHER): Payer: 59

## 2014-02-04 ENCOUNTER — Ambulatory Visit (INDEPENDENT_AMBULATORY_CARE_PROVIDER_SITE_OTHER): Payer: 59 | Admitting: Podiatrist

## 2014-02-04 VITALS — BP 101/64 | HR 99 | Resp 12

## 2014-02-04 DIAGNOSIS — Z9889 Other specified postprocedural states: Secondary | ICD-10-CM

## 2014-02-04 DIAGNOSIS — M216X1 Other acquired deformities of right foot: Secondary | ICD-10-CM

## 2014-02-04 DIAGNOSIS — S92302D Fracture of unspecified metatarsal bone(s), left foot, subsequent encounter for fracture with routine healing: Secondary | ICD-10-CM

## 2014-02-07 NOTE — Progress Notes (Signed)
Chief Complaint  Patient presents with  . Routine Post Op    DOS 12-31-2013  POV Metatarsal osteotomy 2nd met right . RT FOOT STILL HURTING WHEN PUTTING PRESSURE ON IT AND ALL THE TIME .''  . Fracture    Follow up 5th metatarsal left .'' LT FOOT STILL SORE.''     HPI: Patient is 59 y.o. female who presents today for post operative follow up of the right foot for shortening metatarsal osteotomy, and for follow up of a fifth metatarsal fracture she sustained on the left foot shortly after the foot surgery.  She states she has had to put more wight now on the right foot due to having to wear the boot on the left.  She relates both feet are sore.    Allergies  Allergen Reactions  . Celebrex [Celecoxib] Nausea Only  . Z-Pak [Azithromycin] Nausea Only  . Adhesive [Tape] Rash    Physical Exam  Neurovascular status intact and unchanged.  Post operative swelling has improved on the right foot. Mild swelling noted left foot.  xrays show healing metatarsal osteotomy of the right foot.  Oblique fracture is present fifth metatarsal left foot-- minimally improved from the last visit.    Assessment: status post right foot surgery, fracture of left fifth metatarsal.   Plan: recommended utilization of the bone stimulator for the fifth metatarsal fracture and continued use of the boot.   She will continue to wear the darco shoe as well.  i will see her back in 1 month for re xray and recheck.  She is instructed to stay out of work to allow both feet to heal for the next 10-12 weeks.  fmla paperwork was filled out by me reflecting this recommendation.

## 2014-02-26 ENCOUNTER — Other Ambulatory Visit: Payer: Self-pay | Admitting: Neurology

## 2014-02-26 DIAGNOSIS — I720 Aneurysm of carotid artery: Secondary | ICD-10-CM

## 2014-02-27 ENCOUNTER — Ambulatory Visit (INDEPENDENT_AMBULATORY_CARE_PROVIDER_SITE_OTHER): Payer: 59

## 2014-02-27 ENCOUNTER — Encounter: Payer: Self-pay | Admitting: Podiatrist

## 2014-02-27 ENCOUNTER — Ambulatory Visit (INDEPENDENT_AMBULATORY_CARE_PROVIDER_SITE_OTHER): Payer: 59 | Admitting: Podiatrist

## 2014-02-27 VITALS — BP 111/74 | HR 93 | Resp 16

## 2014-02-27 DIAGNOSIS — S92302D Fracture of unspecified metatarsal bone(s), left foot, subsequent encounter for fracture with routine healing: Secondary | ICD-10-CM

## 2014-02-27 DIAGNOSIS — M2011 Hallux valgus (acquired), right foot: Secondary | ICD-10-CM

## 2014-02-27 MED ORDER — DICLOFENAC SODIUM 1 % TD GEL: 2.0000 g | Freq: Four times a day (QID) | TRANSDERMAL | Status: DC

## 2014-02-27 NOTE — Progress Notes (Signed)
Chief Complaint  Patient presents with  . Routine Post Op    DOS 12-31-2013 POV Metatarsal osteotomy 2nd met right    "My foot still hurts"  . Fracture    Follow up 5th metatarsal left    "I'm ready to get out of this boot. It's better"     HPI: Patient is 59 y.o. female who presents today for post operative follow up of the right foot for shortening metatarsal osteotomy, and for follow up of a fifth metatarsal fracture she sustained on the left foot shortly after the foot surgery.  She states her right foot continues to be painful especially at the first interspace and along the dorsal lateral aspect of the foot. The left foot however she states she feels is improved since wearing the boot and using the bone stimulator.   Allergies  Allergen Reactions  . Celebrex [Celecoxib] Nausea Only  . Z-Pak [Azithromycin] Nausea Only  . Adhesive [Tape] Rash    Physical Exam  Neurovascular status intact and unchanged.  Post operative swelling has improved on the right foot. A marble-like discomfort is noted between the first interspace of the right foot. Generalized discomfort on the dorsal lateral aspect of the right second toe is noted. And proximally along the metatarsal shaft itself. Mild swelling noted left foot.    xrays in 10 used to show healing metatarsal osteotomy of the right foot.  Oblique fracture is present fifth metatarsal left foot-- improved from the last visit.    Assessment: status post right foot surgery, fracture of left fifth metatarsal.   Plan: Recommended continued use of the bone stimulator. She may slowly start to wean out of her boot in 2 weeks. She may go back to work on Monday working half days as long as she can sit in proper feet up. Of note was written to reflect this change. Likely the pain between the toe is due to the swelling will reassess again in 4 weeks.

## 2014-03-03 ENCOUNTER — Ambulatory Visit
Admission: RE | Admit: 2014-03-03 | Discharge: 2014-03-03 | Disposition: A | Discharge status: Home / Self Care | Payer: 59 | Source: Ambulatory Visit | Attending: Neurology | Admitting: Neurology

## 2014-03-03 DIAGNOSIS — I720 Aneurysm of carotid artery: Secondary | ICD-10-CM

## 2014-03-14 ENCOUNTER — Other Ambulatory Visit: Payer: Self-pay | Admitting: Internal Medicine

## 2014-03-18 ENCOUNTER — Encounter: Payer: Self-pay | Admitting: Podiatrist

## 2014-03-18 ENCOUNTER — Ambulatory Visit (INDEPENDENT_AMBULATORY_CARE_PROVIDER_SITE_OTHER): Payer: 59

## 2014-03-18 ENCOUNTER — Ambulatory Visit (INDEPENDENT_AMBULATORY_CARE_PROVIDER_SITE_OTHER): Payer: 59 | Admitting: Podiatrist

## 2014-03-18 VITALS — BP 118/72 | HR 92 | Resp 12

## 2014-03-18 DIAGNOSIS — S92302A Fracture of unspecified metatarsal bone(s), left foot, initial encounter for closed fracture: Secondary | ICD-10-CM | POA: Diagnosis not present

## 2014-03-18 DIAGNOSIS — M216X1 Other acquired deformities of right foot: Secondary | ICD-10-CM

## 2014-03-26 NOTE — Progress Notes (Signed)
Chief Complaint  Patient presents with  . Routine Post Op    DOS 12-31-2013 POV Metatarsal osteotomy 2nd met right''rt foot still throbbing and top of the foot is hurting.'' Follow up 5th metatarsal left.''lt foot is doing ok.''      HPI: Patient is 59 y.o. female who presents today for post operative follow up of the right foot for shortening metatarsal osteotomy, and for follow up of a fifth metatarsal fracture she sustained on the left foot shortly after the foot surgery.  She states her right foot continues to be painful . The left foot however she states she feels is improved since wearing the boot and using the bone stimulator.   Allergies  Allergen Reactions  . Celebrex [Celecoxib] Nausea Only  . Z-Pak [Azithromycin] Nausea Only  . Adhesive [Tape] Rash    Physical Exam  Neurovascular status intact and unchanged.  Post operative swelling has improved on the right foot. A marble-like discomfort is improved between the first interspace of the right foot. Generalized discomfort on the dorsal lateral aspect of the right second toe is noted. And proximally along the metatarsal shaft itself. Mild swelling noted left foot. Swelling is present but improved from the last visit. Discomfort is present but improved.   xrays show continued healing metatarsal osteotomy of the right foot.  Oblique fracture is present fifth metatarsal left foot-- improved from the last visit.    Assessment: status post right foot surgery, fracture of left fifth metatarsal.   Plan: Recommended continued use of the bone stimulator for the left foot only. The right foot appears to be healed. She may continue working half days as long as she can sit in proper feet up. Of note was written to reflect this change. will reassess again in 4 weeks and decide on work release at that time.Marland Kitchen

## 2014-04-15 ENCOUNTER — Ambulatory Visit (INDEPENDENT_AMBULATORY_CARE_PROVIDER_SITE_OTHER): Payer: 59 | Admitting: Podiatrist

## 2014-04-15 ENCOUNTER — Ambulatory Visit (INDEPENDENT_AMBULATORY_CARE_PROVIDER_SITE_OTHER): Payer: 59

## 2014-04-15 ENCOUNTER — Encounter: Payer: Self-pay | Admitting: Podiatrist

## 2014-04-15 VITALS — BP 127/71 | HR 78 | Resp 15

## 2014-04-15 DIAGNOSIS — Z9889 Other specified postprocedural states: Secondary | ICD-10-CM

## 2014-04-15 DIAGNOSIS — M216X1 Other acquired deformities of right foot: Secondary | ICD-10-CM

## 2014-05-06 ENCOUNTER — Encounter: Payer: Self-pay | Admitting: Internal Medicine

## 2014-05-06 ENCOUNTER — Other Ambulatory Visit: Payer: Self-pay | Admitting: Internal Medicine

## 2014-05-06 ENCOUNTER — Ambulatory Visit (INDEPENDENT_AMBULATORY_CARE_PROVIDER_SITE_OTHER): Payer: 59 | Admitting: Internal Medicine

## 2014-05-06 VITALS — BP 114/75 | HR 96 | Temp 98.2°F | Resp 16 | Ht 62.0 in | Wt 161.4 lb

## 2014-05-06 DIAGNOSIS — R7989 Other specified abnormal findings of blood chemistry: Secondary | ICD-10-CM

## 2014-05-06 DIAGNOSIS — R609 Edema, unspecified: Secondary | ICD-10-CM | POA: Diagnosis not present

## 2014-05-06 DIAGNOSIS — G479 Sleep disorder, unspecified: Secondary | ICD-10-CM | POA: Diagnosis not present

## 2014-05-06 DIAGNOSIS — E038 Other specified hypothyroidism: Secondary | ICD-10-CM

## 2014-05-06 DIAGNOSIS — M545 Low back pain: Secondary | ICD-10-CM

## 2014-05-06 DIAGNOSIS — R946 Abnormal results of thyroid function studies: Secondary | ICD-10-CM

## 2014-05-06 DIAGNOSIS — F17210 Nicotine dependence, cigarettes, uncomplicated: Secondary | ICD-10-CM | POA: Diagnosis not present

## 2014-05-06 DIAGNOSIS — M25579 Pain in unspecified ankle and joints of unspecified foot: Secondary | ICD-10-CM | POA: Diagnosis not present

## 2014-05-06 DIAGNOSIS — J301 Allergic rhinitis due to pollen: Secondary | ICD-10-CM

## 2014-05-06 DIAGNOSIS — G4700 Insomnia, unspecified: Secondary | ICD-10-CM | POA: Diagnosis not present

## 2014-05-06 DIAGNOSIS — F411 Generalized anxiety disorder: Secondary | ICD-10-CM | POA: Diagnosis not present

## 2014-05-06 DIAGNOSIS — E785 Hyperlipidemia, unspecified: Secondary | ICD-10-CM | POA: Diagnosis not present

## 2014-05-06 LAB — COMPREHENSIVE METABOLIC PANEL
ALK PHOS: 98 U/L (ref 39–117)
ALT: 18 U/L (ref 0–35)
AST: 22 U/L (ref 0–37)
Albumin: 4.2 g/dL (ref 3.5–5.2)
BILIRUBIN TOTAL: 0.3 mg/dL (ref 0.2–1.2)
BUN: 17 mg/dL (ref 6–23)
CO2: 29 mEq/L (ref 19–32)
Calcium: 9.5 mg/dL (ref 8.4–10.5)
Chloride: 105 mEq/L (ref 96–112)
Creat: 0.65 mg/dL (ref 0.50–1.10)
GLUCOSE: 91 mg/dL (ref 70–99)
Potassium: 4.2 mEq/L (ref 3.5–5.3)
Sodium: 139 mEq/L (ref 135–145)
Total Protein: 6.8 g/dL (ref 6.0–8.3)

## 2014-05-06 LAB — POCT URINALYSIS DIPSTICK
BILIRUBIN UA: NEGATIVE
Blood, UA: NEGATIVE
GLUCOSE UA: NEGATIVE
Ketones, UA: NEGATIVE
LEUKOCYTES UA: NEGATIVE
Nitrite, UA: NEGATIVE
Protein, UA: NEGATIVE
Spec Grav, UA: 1.01
Urobilinogen, UA: 0.2
pH, UA: 7

## 2014-05-06 LAB — CBC WITH DIFFERENTIAL/PLATELET
BASOS ABS: 0.1 10*3/uL (ref 0.0–0.1)
Basophils Relative: 1 % (ref 0–1)
EOS ABS: 0.2 10*3/uL (ref 0.0–0.7)
Eosinophils Relative: 2 % (ref 0–5)
HEMATOCRIT: 43.3 % (ref 36.0–46.0)
Hemoglobin: 15.2 g/dL — ABNORMAL HIGH (ref 12.0–15.0)
Lymphocytes Relative: 37 % (ref 12–46)
Lymphs Abs: 3.2 10*3/uL (ref 0.7–4.0)
MCH: 32.7 pg (ref 26.0–34.0)
MCHC: 35.1 g/dL (ref 30.0–36.0)
MCV: 93.1 fL (ref 78.0–100.0)
MONOS PCT: 7 % (ref 3–12)
MPV: 10.9 fL (ref 8.6–12.4)
Monocytes Absolute: 0.6 10*3/uL (ref 0.1–1.0)
NEUTROS ABS: 4.6 10*3/uL (ref 1.7–7.7)
Neutrophils Relative %: 53 % (ref 43–77)
Platelets: 317 10*3/uL (ref 150–400)
RBC: 4.65 MIL/uL (ref 3.87–5.11)
RDW: 13.5 % (ref 11.5–15.5)
WBC: 8.6 10*3/uL (ref 4.0–10.5)

## 2014-05-06 LAB — TSH: TSH: 0.33 u[IU]/mL — AB (ref 0.350–4.500)

## 2014-05-06 LAB — LIPID PANEL
CHOL/HDL RATIO: 3.4 ratio
CHOLESTEROL: 222 mg/dL — AB (ref 0–200)
HDL: 65 mg/dL (ref >=46)
LDL Cholesterol: 136 mg/dL — ABNORMAL HIGH (ref 0–99)
Triglycerides: 107 mg/dL (ref <150)
VLDL: 21 mg/dL (ref 0–40)

## 2014-05-06 LAB — T4, FREE: Free T4: 0.76 ng/dL — ABNORMAL LOW (ref 0.80–1.80)

## 2014-05-06 LAB — POCT SEDIMENTATION RATE: POCT SED RATE: 20 mm/hr (ref 0–22)

## 2014-05-06 MED ORDER — MELOXICAM 15 MG PO TABS: 15.0000 mg | ORAL_TABLET | Freq: Every day | ORAL | Status: DC

## 2014-05-06 MED ORDER — CYCLOBENZAPRINE HCL 10 MG PO TABS: ORAL_TABLET | ORAL | Status: DC

## 2014-05-06 MED ORDER — FLUOXETINE HCL 20 MG PO TABS: 40.0000 mg | ORAL_TABLET | Freq: Every day | ORAL | Status: DC

## 2014-05-06 MED ORDER — DICLOFENAC SODIUM 1 % TD GEL: 2.0000 g | Freq: Four times a day (QID) | TRANSDERMAL | Status: DC

## 2014-05-06 MED ORDER — TRIAZOLAM 0.25 MG PO TABS: ORAL_TABLET | ORAL | Status: DC

## 2014-05-06 MED ORDER — FLUTICASONE PROPIONATE 50 MCG/ACT NA SUSP: 2.0000 | Freq: Every day | NASAL | Status: DC

## 2014-05-06 NOTE — Progress Notes (Signed)
Subjective:    Patient ID: Sandra Waller, female    DOB: 1955/05/08, 59 y.o.   MRN: [id number]  HPI  Chief Complaint  Patient presents with  . Foot Swelling    both feet and legs are swelling x few months  . Leg Swelling  The symptoms are noticed toward the end of the day She has been seeing Dr.Egerton by our referral since November(reviewed surgical and office notes) with surgery to shorten her right 2nd metatarsal and to treat a fracture of her fifth left metatarsal. Despite the fact that she has improved her original problems, she continues to complain of foot pain as she ambulates at work. The pain gets worse toward the end of the day as she has more swelling. Orthotics are in the mail. Her foot swelling is not accompanied by shortness of breath, palpitations, chest pain, paroxysmal nocturnal dyspnea. There are no changes in urinary frequency or volume. There has been no significant change in her medications.   dexa 8/15  Patient Active Problem List   Diagnosis Date Noted  . Low TSH level with low normal free T4-2014-----this needs to be repeated  02/05/2013  . Neuroma, Morton's/////this is an old diagnosis  07/31/2012  . GAD (generalized anxiety disorder)----- see review of systems  07/31/2012  . Aneurysm of internal carotid artery----asymptomatic and stable  07/31/2012  . Chronic back pain-----see review of systems  07/31/2012  . Hyperlipidemia----not on medications and time to repeat lab  12/24/2011  . Nicotine addiction------ she has been unable to quit smoking and she uses cigarettes to control her stress level which is significant now that she has returned to work after her foot surgery  12/24/2011  . Insomnia--- see review of systems  12/24/2011  . Osteopenia-----see last DEXA scan August 2015 ----Fosamax restarted gyn:  "Presents for annual exam. 1993 TAH with BSO for cysts and endometriosis. 2013 DEXA T score -1.5 at right femoral neck, -1.8 at spine had been on  Fosamax weekly until one year ago when she had a traumatic fall had foot surgery, missed annual exam and has been off the Fosamax now one year. Had been on approximately 3 years at that time" 08/09/2009    Prior to Admission medications   Medication Sig Start Date End Date Taking? Authorizing Provider  alendronate (FOSAMAX) 70 MG tablet Take 1 tablet (70 mg total) by mouth once a week. Take with a full glass of water on an empty stomach. 08/14/13  Yes Huel Cote, NP  cyclobenzaprine (FLEXERIL) 10 MG tablet TAKE ONE TABLET BY MOUTH AT BEDTIME. "OV NEEDED FOR ADDITIONAL REFILLS" 03/15/14  Yes Chelle S Jeffery, PA-C  diazepam (VALIUM) 5 MG tablet Take 1 tablet (5 mg total) by mouth every 8 (eight) hours as needed. 11/05/13  Yes Leandrew Koyanagi, MD  diclofenac sodium (VOLTAREN) 1 % GEL Apply 2 g topically 4 (four) times daily. Rub into affected area of foot 2 to 4 times daily 02/27/14  Yes Bronson Ing, DPM  FLUoxetine (PROZAC) 20 MG tablet Take 2 tablets (40 mg total) by mouth daily. 11/22/13  Yes Leandrew Koyanagi, MD  fluticasone (FLONASE) 50 MCG/ACT nasal spray Place 2 sprays into both nostrils daily. 05/28/13  Yes Leandrew Koyanagi, MD  meloxicam (MOBIC) 15 MG tablet Take 1 tablet (15 mg total) by mouth daily. 11/05/13  Yes Leandrew Koyanagi, MD  Multiple Vitamins-Minerals (MULTIVITAMINS THER. W/MINERALS) TABS Take 1 tablet by mouth daily.   Yes Historical Provider, MD  OVER  THE COUNTER MEDICATION Take 1 scoop by mouth daily. Total Soy supplement shake   Yes Historical Provider, MD  triazolam (HALCION) 0.25 MG tablet Take 2 tablets (0.5 mg) at bedtime as needed 11/05/13  Yes Leandrew Koyanagi, MD  TURMERIC CURCUMIN PO Take 500 mg by mouth.   Yes Historical Provider, MD    Review of Systems  Constitutional: Negative for fever, activity change and appetite change.       She frequently has fatigue now that she is resumed work She is not noticed a weight gain but our records show an 11  pound gain since December  HENT: Negative for trouble swallowing.   Eyes: Negative for visual disturbance.  Respiratory: Negative for shortness of breath.   Cardiovascular: Negative for chest pain and palpitations.  Gastrointestinal: Negative for abdominal pain, diarrhea and constipation.  Genitourinary: Negative for frequency.  Musculoskeletal:       She continues to have low back pain with intermittent radicular symptoms--- treatment provided by [facility] and is generally keeping her active note never resolving completely  Neurological: Negative for dizziness and headaches.  Hematological: Does not bruise/bleed easily.  Psychiatric/Behavioral: Positive for sleep disturbance. The patient is nervous/anxious.        Insomnia continues to be a big issue especially now that she is back at work although this is taking care of completely by triazolam if she takes 2 tablets at bedtime While she was out of work for her foot problems she  was able to stop Prozac and had very little anxiety or dysphoric mood. Then she returned to work and immediately needed to resume her medications.       Objective:   Physical Exam BP 114/75 mmHg  Pulse 96  Temp(Src) 98.2 F (36.8 C) (Oral)  Resp 16  Ht _0  (1.575 m)  Wt 161 lb 6.4 oz (73.211 kg)  BMI 29.51 kg/m2  SpO2 97% Wt Readings from Last 3 Encounters:  05/06/14 161 lb 6.4 oz (73.211 kg)  03/03/14 156 lb (70.761 kg)  11/20/13 150 lb (68.04 kg)   HEENT is clear with full extraocular movements and normal pupils No thyromegaly or lymphadenopathy noted in the cervical area Lungs are clear Heart regular without murmur The lower extremities show trace edema distally--surgical scar is well-healed-minimally tender to palpation over the lower legs and the feet without swelling in the ankles or pain with ankle range of motion. There is no redness. 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 Continue Flexeril as needed.  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 She has for years until retirement. She uses occasional Valium for excessive daytime anxiety. She  does not need a refill of this currently.  Low TSH level with low normal free T4-2014 - Plan: TSH, T4, free and call and follow-up plan  Pain in joint, ankle and foot, unspecified laterality - Plan: POCT SEDIMENTATION RATE She'll start using orthotics this week and we will follow this symptom. If she does not respond then we will consult with Dr. Valentina Lucks again///continues diclofenac topical  Edema -this appears minimal by exam today but has been more prominent by history--- unclear etiology Fosamax Plus NSAIDs one consideration/hypothyroidism emerging is another  Plan: Labs including urine/contact with results  Overweight--her inability to exercise due to foot pain has led her to her recent weight gain///needs dietary modification and nonweightbearing exercise  Meds ordered this encounter  Medications  . diclofenac sodium (VOLTAREN) 1 % GEL    Sig: Apply 2 g  topically 4 (four) times daily. Rub into affected area of foot 2 to 4 times daily    Dispense:  100 g    Refill:  2  . triazolam (HALCION) 0.25 MG tablet    Sig: Take 2 tablets (0.5 mg) at bedtime as needed    Dispense:  68 tablet    Refill:  5  . fluticasone (FLONASE) 50 MCG/ACT nasal spray    Sig: Place 2 sprays into both nostrils daily.    Dispense:  16 g    Refill:  11  . cyclobenzaprine (FLEXERIL) 10 MG tablet    Sig: TAKE ONE TABLET BY MOUTH AT BEDTIME. "OV NEEDED FOR ADDITIONAL REFILLS"    Dispense:  34 tablet    Refill:  1  . meloxicam (MOBIC) 15 MG tablet    Sig: Take 1 tablet (15 mg total) by mouth daily.    Dispense:  34 tablet    Refill:  5  . FLUoxetine (PROZAC) 20 MG tablet    Sig: Take 2 tablets (40 mg total) by mouth daily.    Dispense:  60 tablet    Refill:  5    Addendum 05/07/2014-labs Results for orders placed or performed in visit on 05/06/14  CBC with Differential/Platelet  Result Value Ref Range   WBC 8.6 4.0 - 10.5 K/uL   RBC 4.65 3.87 - 5.11 MIL/uL   Hemoglobin 15.2 (H) 12.0 - 15.0 g/dL   HCT 43.3 36.0 - 46.0 %   MCV 93.1 78.0 - 100.0 fL   MCH 32.7 26.0 - 34.0 pg   MCHC 35.1 30.0 - 36.0 g/dL   RDW 13.5 11.5 - 15.5 %   Platelets 317 150 - 400 K/uL   MPV 10.9 8.6 - 12.4 fL   Neutrophils Relative % 53 43 - 77 %   Neutro Abs 4.6 1.7 - 7.7 K/uL   Lymphocytes Relative 37 12 - 46 %   Lymphs Abs 3.2 0.7 - 4.0 K/uL   Monocytes Relative 7 3 - 12 %   Monocytes Absolute 0.6 0.1 - 1.0 K/uL   Eosinophils Relative 2 0 - 5 %   Eosinophils Absolute 0.2 0.0 - 0.7 K/uL   Basophils Relative 1 0 - 1 %   Basophils Absolute 0.1 0.0 - 0.1 K/uL   Smear Review Criteria for review not met   Comprehensive metabolic panel  Result Value Ref Range   Sodium 139 135 - 145 mEq/L   Potassium 4.2 3.5 - 5.3 mEq/L   Chloride 105 96 - 112 mEq/L   CO2 29 19 - 32 mEq/L   Glucose, Bld 91 70 - 99 mg/dL   BUN  17 6 - 23 mg/dL   Creat 0.65 0.50 - 1.10 mg/dL   Total Bilirubin 0.3  0.2 - 1.2 mg/dL   Alkaline Phosphatase 98 39 - 117 U/L   AST 22 0 - 37 U/L   ALT 18 0 - 35 U/L   Total Protein 6.8 6.0 - 8.3 g/dL   Albumin 4.2 3.5 - 5.2 g/dL   Calcium 9.5 8.4 - 10.5 mg/dL  TSH  Result Value Ref Range   TSH 0.330 (L) 0.350 - 4.500 uIU/mL  T4, free  Result Value Ref Range   Free T4 0.76 (L) 0.80 - 1.80 ng/dL  Lipid panel  Result Value Ref Range   Cholesterol 222 (H) 0 - 200 mg/dL   Triglycerides 107 <150 mg/dL   HDL 65 >=46 mg/dL   Total CHOL/HDL Ratio 3.4 Ratio   VLDL 21 0 - 40 mg/dL   LDL Cholesterol 136 (H) 0 - 99 mg/dL  POCT SEDIMENTATION RATE  Result Value Ref Range   POCT SED RATE 20 0 - 22 mm/hr  POCT urinalysis dipstick  Result Value Ref Range   Color, UA Yellow    Clarity, UA Clear    Glucose, UA Negative    Bilirubin, UA Negative    Ketones, UA Negative    Spec Grav, UA 1.010    Blood, UA Negative    pH, UA 7.0    Protein, UA Negative    Urobilinogen, UA 0.2    Nitrite, UA negative    Leukocytes, UA Negative    Now she has low free T4 as well and 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

## 2014-05-07 LAB — PROLACTIN: Prolactin: 4.5 ng/mL

## 2014-05-07 LAB — FOLLICLE STIMULATING HORMONE: FSH: 109.8 m[IU]/mL

## 2014-05-07 LAB — T3: T3 TOTAL: 165.6 ng/dL (ref 80.0–204.0)

## 2014-05-07 LAB — LUTEINIZING HORMONE: LH: 47.6 m[IU]/mL

## 2014-05-08 ENCOUNTER — Ambulatory Visit: Payer: 59 | Admitting: *Deleted

## 2014-05-08 DIAGNOSIS — M779 Enthesopathy, unspecified: Secondary | ICD-10-CM

## 2014-05-08 NOTE — Progress Notes (Signed)
Patient ID: [name], female   DOB: 1955/08/18, 59 y.o.   MRN: [id number] PICKING UP INSERTS

## 2014-05-08 NOTE — Patient Instructions (Signed)

## 2014-05-08 NOTE — Progress Notes (Signed)
Chief Complaint  Patient presents with  . Routine Post Op    right foot still hurting on the bottom     HPI: Patient is 59 y.o. female who presents today for post operative follow up of the right foot for shortening metatarsal osteotomy, and for follow up of a fifth metatarsal fracture she sustained on the left foot shortly after the foot surgery.  She states her right foot continues to be painful . The left foot however she states she feels is improved since wearing the boot and using the bone stimulator.   Physical Exam  Neurovascular status intact and unchanged.  Post operative swelling has improved on the right foot. A marble-like discomfort is improved between the first interspace of the right foot. Generalized discomfort on the plantar aspect  right second toe is noted.    xrays show continued healing metatarsal osteotomy of the right foot.  Oblique fracture is present fifth metatarsal left foot-- improved from the last visit.    Assessment: status post right foot surgery, fracture of left fifth metatarsal.   Plan: Stephanne is ready to return to work..  She says her feet are uncomfortable however she would like to return to work full time.  i released her to full time duty.  Her swelling from surgery should resolve and the pain should resolve with this.  She will call if it does not. She will wear the compression sock as well.

## 2014-05-08 NOTE — Addendum Note (Signed)
Addended by: Johnnette LitterARDWELL, Royelle Hinchman M on: 05/08/2014 05:16 PM   Modules accepted: Orders

## 2014-05-10 ENCOUNTER — Ambulatory Visit (INDEPENDENT_AMBULATORY_CARE_PROVIDER_SITE_OTHER): Payer: 59 | Admitting: Emergency Medicine

## 2014-05-10 VITALS — BP 102/64 | HR 76 | Temp 98.0°F | Resp 20 | Ht 62.0 in | Wt 165.2 lb

## 2014-05-10 DIAGNOSIS — M2662 Arthralgia of temporomandibular joint: Secondary | ICD-10-CM | POA: Diagnosis not present

## 2014-05-10 DIAGNOSIS — M26629 Arthralgia of temporomandibular joint, unspecified side: Secondary | ICD-10-CM

## 2014-05-10 MED ORDER — NAPROXEN SODIUM 550 MG PO TABS
550.0000 mg | ORAL_TABLET | Freq: Two times a day (BID) | ORAL | Status: DC
Start: 2014-05-10 — End: 2014-06-20

## 2014-05-10 NOTE — Progress Notes (Signed)
[facility] [address] [place] [address] [phone]  Date:  05/10/2014   Name:  Sandra Waller   DOB:  Jun 02, 1955   MRN:  [id number]  PCP:  Tonye Pearson, MD    Chief Complaint: Otitis Media and Jaw Pain   History of Present Illness:  Sandra Waller is a 59 y.o. very pleasant female patient who presents with the following:  Patient has a pain in the right jaw into her right ear. Says occurred in march following a dental appt. She has no cough, coryza or sore throat No fever or chills Had some blood from right ear a week ago Says her right TMJ clicks and hurts when she opens her mouth or chews. No history of injury or overuse No antecedent illness No improvement with over the counter medications or other home remedies.  Denies other complaint or health concern today.   Patient Active Problem List   Diagnosis Date Noted  . Low TSH level with low normal free T4-2014 02/05/2013  . Neuroma, Morton's 07/31/2012  . GAD (generalized anxiety disorder) 07/31/2012  . 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 Chronic back pain 07/31/2012  . Hyperlipidemia 12/24/2011  . Nicotine addiction 12/24/2011  . Insomnia 12/24/2011  . Osteopenia 08/09/2009  . ABDOMINAL PAIN, EPIGASTRIC 11/03/2008    Past Medical History  Diagnosis Date  . Osteopenia 08/2013    T score -2.0. Improved from -2.4  . Shingles 06/2011  . Anxiety   . Allergy   . Arthritis     BACK    Past Surgical History  Procedure Laterality Date  . Lavh bso      OVARIAN CYSTS AND ENDOMETRIOSIS  . Abdominal hysterectomy      LAVH  . Nose surgery  2002  . Cysts on wrist removed  94  . Left shoudler surgery    . Cholecystectomy    . Foot surgery Right     History  Substance Use Topics  . Smoking status: Current Every Day Smoker -- 0.50 packs/day for 30 years    Types: Cigarettes  . Smokeless tobacco: Never Used  . Alcohol Use: No    Family History  Problem  Relation Age of Onset  . Diabetes Mother   . Hypertension Sister   . Diabetes Father     Allergies  Allergen Reactions  . Celebrex [Celecoxib] Nausea Only  . Z-Pak [Azithromycin] Nausea Only  . Adhesive [Tape] Rash    Medication list has been reviewed and updated.  Current Outpatient Prescriptions on File Prior to Visit  Medication Sig Dispense Refill  . alendronate (FOSAMAX) 70 MG tablet Take 1 tablet (70 mg total) by mouth once a week. Take with a full glass of water on an empty stomach. 12 tablet 4  . cyclobenzaprine (FLEXERIL) 10 MG tablet TAKE ONE TABLET BY MOUTH AT BEDTIME. "OV NEEDED FOR ADDITIONAL REFILLS" 34 tablet 1  . diazepam (VALIUM) 5 MG tablet Take 1 tablet (5 mg total) by mouth every 8 (eight) hours as needed. 90 tablet 1  . diclofenac sodium (VOLTAREN) 1 % GEL Apply 2 g topically 4 (four) times daily. Rub into affected area of foot 2 to 4 times daily 100 g 2  . FLUoxetine (PROZAC) 20 MG tablet Take 2 tablets (40 mg total) by mouth daily. 60 tablet 5  . fluticasone (FLONASE) 50 MCG/ACT nasal spray Place 2 sprays into both nostrils daily. 16 g 11  . meloxicam (MOBIC) 15 MG tablet  Take 1 tablet (15 mg total) by mouth daily. 34 tablet 5  . Multiple Vitamins-Minerals (MULTIVITAMINS THER. W/MINERALS) TABS Take 1 tablet by mouth daily.    Marland Kitchen. OVER THE COUNTER MEDICATION Take 1 scoop by mouth daily. Total Soy supplement shake    . triazolam (HALCION) 0.25 MG tablet Take 2 tablets (0.5 mg) at bedtime as needed 68 tablet 5  . TURMERIC CURCUMIN PO Take 500 mg by mouth.     No current facility-administered medications on file prior to visit.    Review of Systems:  Review of Systems  Constitutional: Negative for fever, chills and fatigue.  HENT: Negative for congestion, ear pain, hearing loss, postnasal drip, rhinorrhea and sinus pressure.   Eyes: Negative for discharge and redness.  Respiratory: Negative for cough, shortness of breath and wheezing.   Cardiovascular: Negative  for chest pain and leg swelling.  Gastrointestinal: Negative for nausea, vomiting, abdominal pain, constipation and blood in stool.  Genitourinary: Negative for dysuria, urgency and frequency.  Musculoskeletal: Negative for neck stiffness.  Skin: Negative for rash.  Neurological: Negative for seizures, weakness and headaches.     Physical Examination: Filed Vitals:   05/10/14 1010  BP: 102/64  Pulse: 76  Temp: 98 F (36.7 C)  Resp: 20   Filed Vitals:   05/10/14 1010  Height: 5\' 2"  (1.575 m)  Weight: 165 lb 4 oz (74.957 kg)   Body mass index is 30.22 kg/(m^2). Ideal Body Weight: Weight in (lb) to have BMI = 25: 136.4   GEN: WDWN, NAD, Non-toxic, Alert & Oriented x 3 HEENT: Atraumatic, Normocephalic. Oropharynx negative MARKED right TMJ tenderness Ears and Nose: No external deformity.  TM negative EXTR: No clubbing/cyanosis/edema NEURO: Normal gait.  PSYCH: Normally interactive. Conversant. Not depressed or anxious appearing.  Calm demeanor.    Assessment and Plan: TMJ right Valium Anaprox Dentist   Signed Phillips OdorJeffery Clarice Zulauf, MD

## 2014-05-10 NOTE — Patient Instructions (Signed)
Temporomandibular Problems  Temporomandibular joint (TMJ) dysfunction means there are problems with the joint between your jaw and your skull. This is a joint lined by cartilage like other joints in your body but also has a small disc in the joint which keeps the bones from rubbing on each other. These joints are like other joints and can get inflamed (sore) from arthritis and other problems. When this joint gets sore, it can cause headaches and pain in the jaw and the face. CAUSES  Usually the arthritic types of problems are caused by soreness in the joint. Soreness in the joint can also be caused by overuse. This may come from grinding your teeth. It may also come from mis-alignment in the joint. DIAGNOSIS Diagnosis of this condition can often be made by history and exam. Sometimes your caregiver may need X-rays or an MRI scan to determine the exact cause. It may be necessary to see your dentist to determine if your teeth and jaws are lined up correctly. TREATMENT  Most of the time this problem is not serious; however, sometimes it can persist (become chronic). When this happens medications that will cut down on inflammation (soreness) help. Sometimes a shot of cortisone into the joint will be helpful. If your teeth are not aligned it may help for your dentist to make a splint for your mouth that can help this problem. If no physical problems can be found, the problem may come from tension. If tension is found to be the cause, biofeedback or relaxation techniques may be helpful. HOME CARE INSTRUCTIONS   Later in the day, applications of ice packs may be helpful. Ice can be used in a plastic bag with a towel around it to prevent frostbite to skin. This may be used about every 2 hours for 20 to 30 minutes, as needed while awake, or as directed by your caregiver.  Only take over-the-counter or prescription medicines for pain, discomfort, or fever as directed by your caregiver.  If physical therapy was  prescribed, follow your caregiver's directions.  Wear mouth appliances as directed if they were given. Document Released: 09/20/2000 Document Revised: 03/20/2011 Document Reviewed: 12/29/2007 ExitCare Patient Information 2015 [employer]. This information is not intended to replace advice given to you by your health care provider. Make sure you discuss any questions you have with your health care provider.  

## 2014-05-13 ENCOUNTER — Ambulatory Visit (INDEPENDENT_AMBULATORY_CARE_PROVIDER_SITE_OTHER): Payer: 59

## 2014-05-13 ENCOUNTER — Ambulatory Visit (INDEPENDENT_AMBULATORY_CARE_PROVIDER_SITE_OTHER): Payer: 59 | Admitting: Podiatrist

## 2014-05-13 ENCOUNTER — Encounter: Payer: Self-pay | Admitting: Podiatrist

## 2014-05-13 VITALS — BP 107/72 | HR 96 | Resp 12

## 2014-05-13 DIAGNOSIS — Z9889 Other specified postprocedural states: Secondary | ICD-10-CM

## 2014-05-13 DIAGNOSIS — M216X1 Other acquired deformities of right foot: Secondary | ICD-10-CM

## 2014-05-13 NOTE — Progress Notes (Signed)
Chief Complaint  Patient presents with  . Routine Post Op    DOS 12-31-2013 POV Metatarsal osteotomy 2nd met right. ''RT FOOT IS DOING MUCH BETTER.''     HPI: Patient is 59 y.o. female who presents today for post operative follow up of the right foot for shortening metatarsal osteotomy, and for follow up of a fifth metatarsal fracture she sustained on the left foot shortly after the foot surgery.  She states her right foot is finally no longer painful . The left foot is now bothering her at all.   Physical Exam  Neurovascular status intact and unchanged.  Post operative swelling has improved on the right foot. A marble-like discomfort is improved between the first interspace of the right foot. Treatment in discomfort noted on the plantar aspect  right second toe is noted.    xrays show healed metatarsal osteotomy of the right foot.    Assessment: status post right foot surgery,  Plan: Sandra Waller may return to her activities as tolerated. She will wear the compression sock as well. Swelling submetatarsal 2 right should subside in time. If it does not she is instructed to call.

## 2014-05-21 ENCOUNTER — Other Ambulatory Visit: Payer: Self-pay | Admitting: Internal Medicine

## 2014-05-22 NOTE — Telephone Encounter (Signed)
Pt was given a Rx on 4/27 with 5 RFs so I am not sure why she is asking for this?

## 2014-05-23 ENCOUNTER — Other Ambulatory Visit: Payer: Self-pay | Admitting: Internal Medicine

## 2014-05-25 ENCOUNTER — Telehealth: Payer: Self-pay

## 2014-05-25 NOTE — Telephone Encounter (Signed)
triazolam (HALCION) 0.25 MG tablet [[id number]][[id number]]      Order Details    Dose, Route, Frequency: As Directed    Indications of Use: Insomnia   Dispense Quantity:  68 tablet Refills:  5 Fills Remaining:  5          Sig: Take 2 tablets (0.5 mg) at bedtime as needed         Written Date:  05/06/14 Expiration Date:  11/02/14     Start Date:  05/06/14 End Date:  --     Ordering Provider:  -- Authorizing Provider:  Tonye Pearsonobert P Doolittle, MD Ordering User:  Tonye Pearsonobert P Doolittle, MD         Please clarify this Rx, did he mean # 68. Chelle can you help me since Dr. Merla Richesoolittle is not here. The patient has been trying to get this filled since Thursday.

## 2014-05-25 NOTE — Telephone Encounter (Signed)
Rx faxed

## 2014-05-25 NOTE — Telephone Encounter (Signed)
Spoke with pt, advised rx faxed to pharmacy.

## 2014-05-25 NOTE — Telephone Encounter (Signed)
PATIENT STATES SHE WAS IN THE OFFICE TO SEE DR. DOOLITTLE 2 WEEKS AGO FOR HER SLEEP DISTURBANCE. HE WAS GOING TO REFILL HER TRIAZOLAM 0.25 MG. SHE JUST TALK WITH THE PHARMACIST AND THEY TOLD HER IT WAS NEVER SENT OVER. NOW SHE IS COMPLETELY OUT.  BEST PHONE [phone] (HOME) PHARMACY CHOICE IS [facility] ON [address]. MBC

## 2014-06-11 ENCOUNTER — Encounter: Payer: Self-pay | Admitting: Internal Medicine

## 2014-06-11 ENCOUNTER — Ambulatory Visit (INDEPENDENT_AMBULATORY_CARE_PROVIDER_SITE_OTHER): Payer: 59 | Admitting: Internal Medicine

## 2014-06-11 VITALS — BP 108/62 | HR 103 | Temp 98.1°F | Resp 12 | Ht 62.0 in | Wt 164.0 lb

## 2014-06-11 DIAGNOSIS — E236 Other disorders of pituitary gland: Secondary | ICD-10-CM

## 2014-06-11 NOTE — Patient Instructions (Signed)
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  Please schedule an 8 am lab appointment to check pituitary labs.  Please skip the B complex and the Fluticasone spray the day of coming back for labs.  Please come back for a follow-up appointment in 6 months.

## 2014-06-11 NOTE — Progress Notes (Signed)
Patient ID: Sandra Waller Ullom, female   DOB: 02/04/1955, 59 y.o.   MRN: [id number]   HPI  Sandra Waller Cassels is a 59 y.o.-year-old female, referred by her PCP, Dr.Doolittle, in consultation for low TSH with low normal free T4, with suspicion for central hypothyroidism.  Pt. has been found to have a low TSH in 01/2013. At that time, she has a normal free T4. A new TSH in 04/2014 was still a little low, but her free T4 was found to be low now, raising the suspicion for central hypothyroidism.  She does not have a history of pituitary tumors, however, she has a right internal carotid artery aneurysm (MRI/MRA checked for vertigo in 2012), stable on MRI/MRA 03/03/2014 . This aneurysm is 2-3 mm in the right superior hypophyseal artery. She has partially empty sella seen on 2012 brain MRI:    No family history of pituitary tumors.  She is not taking po steroids (is on Fluticasone), narcotics, but she takes a super B complex.  She does not complain of increased thirst, severe fatigue, uncontrolled weight loss, severe headaches, or vision problems. She actually gained weight (12-15 lbs) after her R foot surgery in 12/2013. She also had a L foot fx in 01/2014.  I reviewed pt's thyroid tests: Lab Results  Component Value Date   TSH 0.330* 05/06/2014   TSH 0.269* 05/28/2013   TSH 0.208* 01/15/2013   TSH 0.519 08/15/2010   FREET4 0.76* 05/06/2014   FREET4 0.94 05/28/2013   FREET4 0.98 01/15/2013    Pt describes: - + weight gain - + fatigue after the sx in 12/2014 (was out of work for 3 mo) - no cold intolerance - no depression - + mild constipation - no dry skin - + hair loss - in last 2 years - no palpitations - no tremors - + anxiety - chronic  Pt denies feeling nodules in neck, hoarseness, dysphagia/odynophagia, SOB with lying down.  She has + FH of thyroid disorders in: . No FH of thyroid cancer.  No h/o radiation tx to head or neck. No recent use of iodine supplements.  I reviewed  her chart and she also has a history of HL, chronic back pain, osteopenia.  ROS: Constitutional: see HPI Eyes: no blurry vision, no xerophthalmia ENT: no sore throat, no nodules palpated in throat, no dysphagia/odynophagia, no hoarseness Cardiovascular: no CP/SOB/palpitations/+ leg swelling Respiratory: no cough/SOB Gastrointestinal: no N/V/D/C Musculoskeletal: + both:muscle/joint aches Skin: no rashes, + hair loss, + easy bruising Neurological: no tremors/numbness/tingling/dizziness Psychiatric: no depression/+ anxiety + low libido  Past Medical History  Diagnosis Date  . Osteopenia 08/2013    T score -2.0. Improved from -2.4  . Shingles 06/2011  . Anxiety   . Allergy   . Arthritis     BACK   Past Surgical History  Procedure Laterality Date  . Lavh bso      OVARIAN CYSTS AND ENDOMETRIOSIS  . Abdominal hysterectomy      LAVH  . Nose surgery  2002  . Cysts on wrist removed  94  . Left shoudler surgery    . Cholecystectomy    . Foot surgery Right    History   Social History  . Marital Status: Married    Spouse Name: N/A  . Number of Children: 2   Occupational History  . NeurosurgeonData manager   Social History Main Topics  . Smoking status: Current Every Day Smoker -- 0.50 packs/day for 30 years    Types: Cigarettes  . Smokeless  tobacco: Never Used  . Alcohol Use: No  . Drug Use: No  . Sexual Activity:    Partners: Male    Birth Control/ Protection: Surgical   Current Outpatient Prescriptions on File Prior to Visit  Medication Sig Dispense Refill  . alendronate (FOSAMAX) 70 MG tablet Take 1 tablet (70 mg total) by mouth once a week. Take with a full glass of water on an empty stomach. 12 tablet 4  . cyclobenzaprine (FLEXERIL) 10 MG tablet TAKE ONE TABLET BY MOUTH AT BEDTIME. "OV NEEDED FOR ADDITIONAL REFILLS" 34 tablet 1  . diazepam (VALIUM) 5 MG tablet Take 1 tablet (5 mg total) by mouth every 8 (eight) hours as needed. 90 tablet 1  . diclofenac sodium (VOLTAREN) 1 %  GEL Apply 2 g topically 4 (four) times daily. Rub into affected area of foot 2 to 4 times daily 100 g 2  . FLUoxetine (PROZAC) 20 MG tablet Take 2 tablets (40 mg total) by mouth daily. 60 tablet 5  . fluticasone (FLONASE) 50 MCG/ACT nasal spray Place 2 sprays into both nostrils daily. 16 g 11  . meloxicam (MOBIC) 15 MG tablet Take 1 tablet (15 mg total) by mouth daily. 34 tablet 5  . Multiple Vitamins-Minerals (MULTIVITAMINS THER. W/MINERALS) TABS Take 1 tablet by mouth daily.    . naproxen sodium (ANAPROX DS) 550 MG tablet Take 1 tablet (550 mg total) by mouth 2 (two) times daily with a meal. 40 tablet 0  . OVER THE COUNTER MEDICATION Take 1 scoop by mouth daily. Total Soy supplement shake    . triazolam (HALCION) 0.25 MG tablet TAKE TWO TABLETS BY MOUTH AT BEDTIME AS NEEDED 60 tablet 0  . TURMERIC CURCUMIN PO Take 500 mg by mouth.     No current facility-administered medications on file prior to visit.   Allergies  Allergen Reactions  . Celebrex [Celecoxib] Nausea Only  . Z-Pak [Azithromycin] Nausea Only  . Adhesive [Tape] Rash   Family History  Problem Relation Age of Onset  . Diabetes Mother   . Hypertension Sister   . Diabetes Father    PE: BP 108/62 mmHg  Pulse 103  Temp(Src) 98.1 F (36.7 C) (Oral)  Resp 12  Ht  (1.575 m)  Wt 164 lb (74.39 kg)  BMI 29.99 kg/m2  SpO2 98% Wt Readings from Last 3 Encounters:  06/11/14 164 lb (74.39 kg)  05/10/14 165 lb 4 oz (74.957 kg)  05/06/14 161 lb 6.4 oz (73.211 kg)   Constitutional: overweight, in NAD Eyes: PERRLA, EOMI, no exophthalmos ENT: moist mucous membranes, no thyromegaly, no cervical lymphadenopathy Cardiovascular: RRR, No MRG Respiratory: CTA B Gastrointestinal: abdomen soft, NT, ND, BS+ Musculoskeletal: no deformities, strength intact in all 4 Skin: moist, warm, no rashes Neurological: no tremor with outstretched hands, DTR normal in all 4  ASSESSMENT: 1. Low TSH  2. Partially empty sella  PLAN:  1.  Patient with repeatedly low TSH levels and 1 free T4 <LLN. She appears euthyroid, but had weight gain after her foot sx and also has fatigue that also started after the sx. She does not appear to have a goiter, thyroid nodules, or neck compression symptoms. - she is on Biotin, which can interfere with the TSH assay and could be a reason why TSH was low >> will need to repeat after being ff Biotin for >24 h. - However, she has 2 other possible reasons for her low TSH: her hypophyseal artery aneurysm which may be compressing the pituitary and  also her partially empty sella.  - to investigate her pituitary fxn, will check: Orders Placed This Encounter  Procedures  . TSH  . T4, free  . T3, free  . Insulin-like growth factor  . Prolactin  . Cortisol  . ACTH  . FSH/LH  - I advised her to return to have these labs drawn off biotin. If she turns out to have central hypothyroidism, she will need to be replaced with levothyroxine. - If these are normal, I will see her back in 6 months  2. 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 No other hormonal abnormalities. FSH and LH appropriately elevated in the  setting of menopause.

## 2014-06-12 ENCOUNTER — Other Ambulatory Visit (INDEPENDENT_AMBULATORY_CARE_PROVIDER_SITE_OTHER): Payer: 59

## 2014-06-12 DIAGNOSIS — E236 Other disorders of pituitary gland: Secondary | ICD-10-CM

## 2014-06-12 LAB — TSH: TSH: 0.35 u[IU]/mL (ref 0.35–4.50)

## 2014-06-12 LAB — T3, FREE: T3, Free: 3.1 pg/mL (ref 2.3–4.2)

## 2014-06-12 LAB — T4, FREE: Free T4: 0.76 ng/dL (ref 0.60–1.60)

## 2014-06-12 LAB — CORTISOL: CORTISOL PLASMA: 8.1 ug/dL

## 2014-06-13 LAB — FSH/LH
FSH: 102.5 m[IU]/mL
LH: 50.3 m[IU]/mL

## 2014-06-13 LAB — PROLACTIN: PROLACTIN: 6.6 ng/mL

## 2014-06-15 LAB — ACTH

## 2014-06-16 LAB — INSULIN-LIKE GROWTH FACTOR
IGF-I, LC/MS: 57 ng/mL (ref 50–317)
Z-Score (Female): -1.7 SD (ref ?–2.0)

## 2014-06-20 ENCOUNTER — Other Ambulatory Visit: Payer: Self-pay | Admitting: Physician Assistant

## 2014-06-20 ENCOUNTER — Other Ambulatory Visit: Payer: Self-pay | Admitting: Emergency Medicine

## 2014-06-22 NOTE — Telephone Encounter (Signed)
Dr Anderson, do you want to give RFs? 

## 2014-06-24 ENCOUNTER — Other Ambulatory Visit: Payer: Self-pay | Admitting: Physician Assistant

## 2014-06-25 NOTE — Telephone Encounter (Signed)
Pt called and reported that the pharm states they don't have a record of this Rx. Had operator advise pt that I will call it in again to make sure the pharm has it, and then called into [facility] as verified by pt.

## 2014-07-03 ENCOUNTER — Other Ambulatory Visit: Payer: Self-pay | Admitting: Internal Medicine

## 2014-07-03 NOTE — Telephone Encounter (Signed)
Rx faxed to pharm

## 2014-08-05 ENCOUNTER — Other Ambulatory Visit: Payer: Self-pay | Admitting: Podiatrist

## 2014-08-10 NOTE — Telephone Encounter (Signed)
Pt needs an appt prior to future refills, if continuing to have pain. 

## 2014-08-27 ENCOUNTER — Telehealth: Payer: Self-pay | Admitting: *Deleted

## 2014-08-27 NOTE — Telephone Encounter (Signed)
Faxed notice [facility], states pt has current rx for Diclofenac Sodium and Naproxen Sodium, and increase probability of adverse reactions.  I spoke with pt, she states she is not using the Diclofenac gel, and is using the Naproxen currently.

## 2014-11-17 ENCOUNTER — Other Ambulatory Visit: Payer: Self-pay

## 2014-11-23 ENCOUNTER — Encounter: Payer: Self-pay | Admitting: Women's Health

## 2014-12-07 ENCOUNTER — Encounter: Payer: Self-pay | Admitting: Internal Medicine

## 2014-12-11 ENCOUNTER — Ambulatory Visit: Payer: 59 | Admitting: Internal Medicine

## 2014-12-19 ENCOUNTER — Other Ambulatory Visit: Payer: Self-pay | Admitting: Physician Assistant

## 2014-12-22 ENCOUNTER — Other Ambulatory Visit: Payer: Self-pay | Admitting: Internal Medicine

## 2014-12-22 NOTE — Telephone Encounter (Signed)
Patient request a refill on a 34 day supply of Triazolam MG  Tab. Patient request for more than one refill. Please call patient at [phone].

## 2014-12-22 NOTE — Telephone Encounter (Signed)
Rx faxed

## 2014-12-24 ENCOUNTER — Other Ambulatory Visit: Payer: Self-pay | Admitting: *Deleted

## 2014-12-24 MED ORDER — TRIAZOLAM 0.25 MG PO TABS: ORAL_TABLET | ORAL | Status: DC

## 2014-12-24 NOTE — Telephone Encounter (Signed)
Pharm faxed req stating that pt is asking for #68 of Halcion since ins will pay for 34 day supply. I looks like she is also asking for more than one RF. Dr Merla Richesoolittle, please advise.

## 2014-12-24 NOTE — Addendum Note (Signed)
Addended by: Sheppard PlumberBRIGGS, Onix Jumper A on: 12/24/2014 01:28 PM   Modules accepted: Orders

## 2014-12-25 NOTE — Telephone Encounter (Signed)
Called in Rx and asked them to DC the one recently sent for #60. Notified pt on VM

## 2015-01-13 ENCOUNTER — Encounter: Payer: Self-pay | Admitting: Internal Medicine

## 2015-01-13 ENCOUNTER — Ambulatory Visit (INDEPENDENT_AMBULATORY_CARE_PROVIDER_SITE_OTHER): Payer: 59 | Admitting: Internal Medicine

## 2015-01-13 ENCOUNTER — Ambulatory Visit (INDEPENDENT_AMBULATORY_CARE_PROVIDER_SITE_OTHER): Payer: 59

## 2015-01-13 VITALS — BP 104/71 | HR 81 | Temp 98.1°F | Resp 16 | Ht 62.0 in | Wt 161.0 lb

## 2015-01-13 DIAGNOSIS — M898X8 Other specified disorders of bone, other site: Secondary | ICD-10-CM

## 2015-01-13 DIAGNOSIS — J301 Allergic rhinitis due to pollen: Secondary | ICD-10-CM

## 2015-01-13 DIAGNOSIS — G4700 Insomnia, unspecified: Secondary | ICD-10-CM | POA: Diagnosis not present

## 2015-01-13 DIAGNOSIS — F411 Generalized anxiety disorder: Secondary | ICD-10-CM

## 2015-01-13 DIAGNOSIS — G8929 Other chronic pain: Secondary | ICD-10-CM | POA: Diagnosis not present

## 2015-01-13 DIAGNOSIS — M89319 Hypertrophy of bone, unspecified shoulder: Secondary | ICD-10-CM

## 2015-01-13 DIAGNOSIS — M549 Dorsalgia, unspecified: Secondary | ICD-10-CM

## 2015-01-13 DIAGNOSIS — F17210 Nicotine dependence, cigarettes, uncomplicated: Secondary | ICD-10-CM | POA: Diagnosis not present

## 2015-01-13 MED ORDER — NABUMETONE 750 MG PO TABS: 750.0000 mg | ORAL_TABLET | Freq: Every day | ORAL | Status: DC

## 2015-01-13 MED ORDER — TRIAZOLAM 0.25 MG PO TABS: ORAL_TABLET | ORAL | Status: AC

## 2015-01-13 MED ORDER — FLUTICASONE PROPIONATE 50 MCG/ACT NA SUSP: 2.0000 | Freq: Every day | NASAL | Status: DC

## 2015-01-13 MED ORDER — CYCLOBENZAPRINE HCL 10 MG PO TABS: ORAL_TABLET | ORAL | Status: DC

## 2015-01-13 NOTE — Progress Notes (Signed)
   Subjective:    Patient ID: Sandra Waller, female    DOB: [birth year], 60 y.o.   MRN: [id number]  HPIf/u meds Also has concern that has swollen area neck-6 mo/nontender Patient Active Problem List   Diagnosis Date Noted  . Low TSH level with low normal free T4-2014--see note Dr Reece AgarG 02/05/2013  . Neuroma, Morton's 07/31/2012  . GAD (generalized anxiety disorder)---off proz 2-3 weeks now w/o rec of sxtoms 07/31/2012  . Aneurysm of internal carotid artery--see scan-OK 07/31/2012  . Chronic back pain--chiropr helping///also NSAIDs and flex hs prn 07/31/2012  . Hyperlipidemia 12/24/2011  . Nicotine addiction--still stopping and starting 12/24/2011  . Insomnia---out of meds 2 weeks and can't fall asleep This is about only thing she takes daily 12/24/2011      Review of Systems  Constitutional: Negative for activity change, appetite change and unexpected weight change.  HENT: Negative for trouble swallowing.   Eyes: Negative for visual disturbance.  Respiratory: Negative for shortness of breath.   Cardiovascular: Negative for chest pain, palpitations and leg swelling.  Gastrointestinal: Negative for abdominal pain.  Genitourinary: Negative for difficulty urinating.  Neurological: Negative for light-headedness and headaches.  Psychiatric/Behavioral: Negative for behavioral problems and dysphoric mood.       Objective:   Physical Exam  Constitutional: She is oriented to person, place, and time. She appears well-developed and well-nourished. No distress.  HENT:  Head: Normocephalic and atraumatic.  Eyes: Pupils are equal, round, and reactive to light.  Neck: Normal range of motion.  Cardiovascular: Normal rate and regular rhythm.   Pulmonary/Chest: Effort normal. No respiratory distress.  Musculoskeletal: Normal range of motion.  R medial clavicle prominent compared to L--nontender  Neurological: She is alert and oriented to person, place, and time.  Skin: Skin is warm and dry.    Psychiatric: She has a normal mood and affect. Her behavior is normal.  Nursing note and vitals reviewed.  BP 104/71 mmHg  Pulse 81  Temp(Src) 98.1 F (36.7 C)  Resp 16  Ht 5\' 2"  (1.575 m)  Wt 161 lb (73.029 kg)  BMI 29.44 kg/m2       [facility] reading (PRIMARY) by  Dr. Josephina Gipoolittle=no bony abnormality   Assessment & Plan:  Clavicle enlargement - Plan: follow for size change  Cigarette nicotine dependence without complication---try cessation again  Insomnia---ref meds  Allergic rhinitis due to pollen, unspecified rhinitis seasonality - Plan: fluticasone (FLONASE) 50 MCG/ACT nasal spray  Back pain--try relafen w/ flex and add yoga to chiropr  GAD--try off SSRIs for now---has rare use prn valium  Meds ordered this encounter  Medications  . cyclobenzaprine (FLEXERIL) 10 MG tablet    Sig: TAKE ONE TABLET BY MOUTH AT BEDTIME.    Dispense:  34 tablet    Refill:  5  . triazolam (HALCION) 0.25 MG tablet    Sig: TAKE TWO TABLETS BY MOUTH AT BEDTIME AS NEEDED    Dispense:  68 tablet    Refill:  5  . fluticasone (FLONASE) 50 MCG/ACT nasal spray    Sig: Place 2 sprays into both nostrils daily.    Dispense:  16 g    Refill:  11  . nabumetone (RELAFEN) 750 MG tablet    Sig: Take 1 tablet (750 mg total) by mouth daily.    Dispense:  34 tablet    Refill:  5

## 2015-03-01 ENCOUNTER — Encounter: Payer: Self-pay | Admitting: Gastroenterology

## 2015-06-13 ENCOUNTER — Other Ambulatory Visit: Payer: Self-pay | Admitting: Internal Medicine

## 2015-06-14 NOTE — Telephone Encounter (Signed)
Called in.

## 2015-07-04 ENCOUNTER — Other Ambulatory Visit: Payer: Self-pay | Admitting: Internal Medicine

## 2016-04-15 ENCOUNTER — Other Ambulatory Visit: Payer: Self-pay

## 2016-04-15 MED ORDER — FLUTICASONE PROPIONATE 50 MCG/ACT NA SUSP: 2.0000 | Freq: Every day | NASAL | 0 refills | Status: AC

## 2018-08-16 ENCOUNTER — Other Ambulatory Visit: Payer: Self-pay | Admitting: Physician Assistant

## 2018-08-16 DIAGNOSIS — Z87891 Personal history of nicotine dependence: Secondary | ICD-10-CM

## 2018-08-26 ENCOUNTER — Other Ambulatory Visit: Payer: Self-pay

## 2018-08-26 ENCOUNTER — Ambulatory Visit
Admission: RE | Admit: 2018-08-26 | Discharge: 2018-08-26 | Disposition: A | Discharge status: Home / Self Care | Payer: BC Managed Care – PPO | Source: Ambulatory Visit | Attending: Physician Assistant | Admitting: Physician Assistant

## 2018-08-26 DIAGNOSIS — Z87891 Personal history of nicotine dependence: Secondary | ICD-10-CM

## 2018-09-12 ENCOUNTER — Ambulatory Visit (INDEPENDENT_AMBULATORY_CARE_PROVIDER_SITE_OTHER): Payer: BC Managed Care – PPO

## 2018-09-12 ENCOUNTER — Other Ambulatory Visit: Payer: Self-pay

## 2018-09-12 ENCOUNTER — Encounter: Payer: Self-pay | Admitting: Emergency Medicine

## 2018-09-12 ENCOUNTER — Ambulatory Visit
Admission: EM | Admit: 2018-09-12 | Discharge: 2018-09-12 | Disposition: A | Discharge status: Home / Self Care | Payer: BC Managed Care – PPO

## 2018-09-12 DIAGNOSIS — S6992XA Unspecified injury of left wrist, hand and finger(s), initial encounter: Secondary | ICD-10-CM

## 2018-09-12 DIAGNOSIS — W208XXA Other cause of strike by thrown, projected or falling object, initial encounter: Secondary | ICD-10-CM | POA: Diagnosis not present

## 2018-09-12 NOTE — ED Triage Notes (Signed)
Pt presents to [facility] for assessment of left pinky finger crush injury from a bottle of wine falling from the cabinet onto her finger.  Finger is split open, bleeding controlled.

## 2018-09-12 NOTE — ED Provider Notes (Signed)
[facility]    CSN: [id number] Arrival date & time: 09/12/18  1629      History   Chief Complaint Chief Complaint  Patient presents with  . Hand Pain    HPI Rose FillersRobin Borrero is a 63 y.o. female with history of arthritis, osteopenia presenting for left pinky finger injury.  States that she was trying get a bottle 1 that fell from the cabinet on her finger.  Denies the wine bottle breaking, though does have open wound to pinky with adipose tissue protruding.  She has been icing since this happened shortly before arrival.  Patient denies being on anticoagulation, blood thinners.  Hemostasis achieved PTA.    Past Medical History:  Diagnosis Date  . Allergy   . Anxiety   . Arthritis    BACK  . Osteopenia 08/2013   T score -2.0. Improved from -2.4  . Shingles 06/2011    Patient Active Problem List   Diagnosis Date Noted  . Low TSH level with low normal free T4-2014 02/05/2013  . Neuroma, Morton's 07/31/2012  . GAD (generalized anxiety disorder) 07/31/2012  . 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 Chronic back pain 07/31/2012  . Hyperlipidemia 12/24/2011  . Nicotine addiction 12/24/2011  . Insomnia 12/24/2011  . Osteopenia 08/09/2009  . ABDOMINAL PAIN, EPIGASTRIC 11/03/2008    Past Surgical History:  Procedure Laterality Date  . ABDOMINAL HYSTERECTOMY     LAVH  . CHOLECYSTECTOMY    . CYSTS ON WRIST REMOVED  94  . FOOT SURGERY Right   . LAVH BSO     OVARIAN CYSTS AND ENDOMETRIOSIS  . LEFT SHOUDLER SURGERY    . NOSE SURGERY  2002    OB History    Gravida  2   Para  2   Term      Preterm      AB      Living  2     SAB      TAB      Ectopic      Multiple      Live Births               Home Medications    Prior to Admission medications   Medication Sig Start Date End Date Taking? Authorizing Provider  atorvastatin (LIPITOR) 10 MG tablet Take 10 mg by mouth daily.   Yes [provider]  FLUoxetine (PROZAC)  10 MG tablet Take 10 mg by mouth daily.   Yes [provider]  gabapentin (NEURONTIN) 300 MG capsule Take 300 mg by mouth daily.   Yes [provider]  B Complex-C (SUPER B COMPLEX PO) Take 1 capsule by mouth daily.    [provider]  fluticasone (FLONASE) 50 MCG/ACT nasal spray Place 2 sprays into both nostrils daily. 04/15/16   Valarie ConesWeber, Dema SeverinSarah L, PA-C  Multiple Vitamins-Minerals (MULTIVITAMINS THER. W/MINERALS) TABS Take 1 tablet by mouth daily.    [provider]  nabumetone (RELAFEN) 750 MG tablet Take 1 tablet (750 mg total) by mouth daily. 01/13/15   Tonye Pearsonoolittle, Robert P, MD  OVER THE COUNTER MEDICATION Take 1 scoop by mouth daily. Total Soy supplement shake    [provider]  triazolam (HALCION) 0.25 MG tablet TAKE TWO TABLETS BY MOUTH AT BEDTIME AS NEEDED 01/13/15   Tonye Pearsonoolittle, Robert P, MD  TURMERIC CURCUMIN PO Take 500 mg by mouth.    [provider]  VOLTAREN 1 % GEL APPLY 2 GRAMS TOPICALLY 4 TIMES DAILY. RUB  INTO AFFECTED AREA OF FOOT 2 TO 4 TIMES DAILY 08/10/14   Wallene Huh, DPM    Family History Family History  Problem Relation Age of Onset  . Diabetes Mother   . Diabetes Father   . Hypertension Sister     Social History Social History   Tobacco Use  . Smoking status: Former Smoker    Packs/day: 0.50    Years: 30.00    Pack years: 15.00    Types: Cigarettes    Quit date: 06/2018    Years since quitting: 0.2  . Smokeless tobacco: Never Used  Substance Use Topics  . Alcohol use: No    Alcohol/week: 0.0 standard drinks  . Drug use: No     Allergies   Celebrex [celecoxib], Lipitor [atorvastatin calcium], Z-pak [azithromycin], and Adhesive [tape]   Review of Systems Review of Systems  Constitutional: Negative for fatigue and fever.  Respiratory: Negative for cough and shortness of breath.   Cardiovascular: Negative for chest pain and palpitations.  Musculoskeletal:       Positive for left fifth digit pain,  swelling, ecchymosis.  Neurological: Negative for weakness and numbness.     Physical Exam Triage Vital Signs ED Triage Vitals [09/12/18 1642]  Enc Vitals Group     BP (!) 113/57     Pulse Rate 75     Resp 18     Temp 97.8 F (36.6 C)     Temp Source Oral     SpO2 96 %     Weight      Height      Head Circumference      Peak Flow      Pain Score 8     Pain Loc      Pain Edu?      Excl. in [place]?    No data found.  Updated Vital Signs BP (!) 113/57 (BP Location: Right Arm)   Pulse 75   Temp 97.8 F (36.6 C) (Oral)   Resp 18   SpO2 96%   Visual Acuity Right Eye Distance:   Left Eye Distance:   Bilateral Distance:    Right Eye Near:   Left Eye Near:    Bilateral Near:     Physical Exam Constitutional:      General: She is not in acute distress. HENT:     Head: Normocephalic and atraumatic.  Eyes:     General: No scleral icterus.    Conjunctiva/sclera: Conjunctivae normal.     Pupils: Pupils are equal, round, and reactive to light.  Cardiovascular:     Rate and Rhythm: Normal rate.  Pulmonary:     Effort: Pulmonary effort is normal. No respiratory distress.  Musculoskeletal:     Comments: Full active ROM of PIP, DIP.  Tender to palpation over the lesion.  NVI  Skin:    Capillary Refill: Capillary refill takes less than 2 seconds.     Findings: Bruising present.     Comments: Less than 1 cm open wound from crush injury with protruding adipose tissue.  No nailbed damage.  Neurological:     General: No focal deficit present.     Mental Status: She is alert.      UC Treatments / Results  Labs (all labs ordered are listed, but only abnormal results are displayed) Labs Reviewed - No data to display  EKG   Radiology Dg Finger Little Left  Result Date: 09/12/2018 CLINICAL DATA:  63 year old who sustained a crush injury to  the LEFT small finger when a bottle was dropped onto the finger earlier today. Laceration involving the distal tuft. Initial  encounter. EXAM: LEFT LITTLE FINGER 2+V COMPARISON:  None. FINDINGS: Soft tissue injury involving the distal tuft related to the laceration. No evidence of acute fracture or dislocation. Moderate narrowing of the IP joint spaces. Relatively well-preserved bone mineral density. No opaque foreign bodies in the soft tissues. IMPRESSION: 1. No acute osseous abnormality. Moderate osteoarthritis. 2. No radiopaque foreign body. Electronically Signed   By: Hulan Saas M.D.   On: 09/12/2018 17:10    Procedures Laceration Repair  Date/Time: 09/12/2018 6:13 PM Performed by: Shea Evans, PA-C Authorized by: Shea Evans, PA-C   Consent:    Consent obtained:  Verbal   Consent given by:  Patient   Risks discussed:  Infection, need for additional repair, pain, poor cosmetic result and poor wound healing   Alternatives discussed:  No treatment and delayed treatment Universal protocol:    Patient identity confirmed:  Verbally with patient Anesthesia (see MAR for exact dosages):    Anesthesia method:  Local infiltration   Local anesthetic:  Lidocaine 2% w/o epi Repair type:    Repair type:  Simple Pre-procedure details:    Preparation:  Patient was prepped and draped in usual sterile fashion Exploration:    Hemostasis achieved with:  Direct pressure   Wound exploration: wound explored through full range of motion     Wound extent: areolar tissue violated     Wound extent: no fascia violation noted, no foreign bodies/material noted, no muscle damage noted, no nerve damage noted, no tendon damage noted, no underlying fracture noted and no vascular damage noted     Contaminated: no   Treatment:    Area cleansed with:  Hibiclens   Amount of cleaning:  Standard   Irrigation solution:  Sterile saline and tap water   Irrigation volume:  50 cc   Irrigation method:  Pressure wash   Visualized foreign bodies/material removed: no   Skin repair:    Repair method:  Sutures   Suture  size:  5-0   Suture material:  Prolene   Suture technique:  Horizontal mattress   Number of sutures:  1 Approximation:    Approximation:  Close Post-procedure details:    Dressing:  Adhesive bandage   Patient tolerance of procedure:  Tolerated well, no immediate complications   (including critical care time)  Medications Ordered in UC Medications - No data to display  Initial Impression / Assessment and Plan / UC Course  I have reviewed the triage vital signs and the nursing notes.  Pertinent labs & imaging results that were available during my care of the patient were reviewed by me and considered in my medical decision making (see chart for details).     1.  Left acute finger injury Patient denies foreign body exposure.  X-ray done in office, reviewed by me and radiology: No acute osseous abnormality, radiopaque foreign body.  Incidentally did show IP joint space narrowing is been consistent with moderate OA.  2 sutures placed in open wound, which patient tolerated well.  Manage pain supportively as listed below.  Contact information provided for hand.  Return precautions discussed, patient verbalized understanding and is agreeable to plan. Final Clinical Impressions(s) / UC Diagnoses   Final diagnoses:  Injury of finger of left hand, initial encounter     Discharge Instructions     May ice, rest, elevate the area(s) of pain.   May use  OTC Tylenol, ibuprofen as needed for pain. Return if you develop worsening pain, swelling, numbness, discoloration, inability to move finger, fever.    ED Prescriptions    None     Controlled Substance Prescriptions [place] Controlled Substance Registry consulted? Not Applicable   Shea EvansHall-Potvin, , [place] JerseyPA-C 09/12/18 1815

## 2018-09-12 NOTE — Discharge Instructions (Addendum)
May ice, rest, elevate the area(s) of pain.   May use OTC Tylenol, ibuprofen as needed for pain. Return if you develop worsening pain, swelling, numbness, discoloration, inability to move finger, fever.

## 2018-09-12 NOTE — ED Notes (Signed)
Patient able to ambulate independently  

## 2018-09-13 ENCOUNTER — Telehealth: Payer: Self-pay | Admitting: Emergency Medicine

## 2018-09-13 NOTE — Telephone Encounter (Signed)
Checked in on patient and encouraged return call with any continuing questions or concerns.    

## 2018-10-25 ENCOUNTER — Encounter: Payer: Self-pay | Admitting: Gastroenterology

## 2019-05-02 ENCOUNTER — Other Ambulatory Visit: Payer: Self-pay | Admitting: Neurosurgery

## 2019-05-02 DIAGNOSIS — I671 Cerebral aneurysm, nonruptured: Secondary | ICD-10-CM

## 2019-05-19 ENCOUNTER — Ambulatory Visit
Admission: RE | Admit: 2019-05-19 | Discharge: 2019-05-19 | Disposition: A | Discharge status: Home / Self Care | Payer: BC Managed Care – PPO | Source: Ambulatory Visit | Attending: Neurosurgery | Admitting: Neurosurgery

## 2019-05-19 ENCOUNTER — Other Ambulatory Visit: Payer: Self-pay

## 2019-05-19 DIAGNOSIS — I671 Cerebral aneurysm, nonruptured: Secondary | ICD-10-CM

## 2019-05-19 MED ORDER — IOPAMIDOL (ISOVUE-370) INJECTION 76%
75.0000 mL | Freq: Once | INTRAVENOUS | Status: AC | PRN
  Administered 2019-05-19: 11:00:00 75 mL via INTRAVENOUS

## 2020-08-23 ENCOUNTER — Other Ambulatory Visit: Payer: Self-pay

## 2020-08-23 ENCOUNTER — Emergency Department (HOSPITAL_BASED_OUTPATIENT_CLINIC_OR_DEPARTMENT_OTHER)
Admission: EM | Admit: 2020-08-23 | Discharge: 2020-08-24 | Disposition: A | Discharge status: Home / Self Care | Payer: BC Managed Care – PPO | Attending: Emergency Medicine | Admitting: Emergency Medicine

## 2020-08-23 ENCOUNTER — Encounter (HOSPITAL_BASED_OUTPATIENT_CLINIC_OR_DEPARTMENT_OTHER): Payer: Self-pay

## 2020-08-23 ENCOUNTER — Emergency Department (HOSPITAL_BASED_OUTPATIENT_CLINIC_OR_DEPARTMENT_OTHER): Payer: BC Managed Care – PPO

## 2020-08-23 DIAGNOSIS — U071 COVID-19: Secondary | ICD-10-CM | POA: Insufficient documentation

## 2020-08-23 DIAGNOSIS — F17210 Nicotine dependence, cigarettes, uncomplicated: Secondary | ICD-10-CM | POA: Insufficient documentation

## 2020-08-23 DIAGNOSIS — E860 Dehydration: Secondary | ICD-10-CM | POA: Diagnosis not present

## 2020-08-23 DIAGNOSIS — R5383 Other fatigue: Secondary | ICD-10-CM | POA: Diagnosis present

## 2020-08-23 LAB — BASIC METABOLIC PANEL
Anion gap: 6 (ref 5–15)
BUN: 8 mg/dL (ref 8–23)
CO2: 30 mmol/L (ref 22–32)
Calcium: 9 mg/dL (ref 8.9–10.3)
Chloride: 105 mmol/L (ref 98–111)
Creatinine, Ser: 0.66 mg/dL (ref 0.44–1.00)
GFR, Estimated: >60 mL/min (ref >60)
Glucose, Bld: 81 mg/dL (ref 70–99)
Potassium: 4.1 mmol/L (ref 3.5–5.1)
Sodium: 141 mmol/L (ref 135–145)

## 2020-08-23 LAB — CBC
HCT: 38.8 % (ref 36.0–46.0)
Hemoglobin: 13.2 g/dL (ref 12.0–15.0)
MCH: 32.3 pg (ref 26.0–34.0)
MCHC: 34 g/dL (ref 30.0–36.0)
MCV: 94.9 fL (ref 80.0–100.0)
Platelets: 223 10*3/uL (ref 150–400)
RBC: 4.09 MIL/uL (ref 3.87–5.11)
RDW: 12.1 % (ref 11.5–15.5)
WBC: 8.9 10*3/uL (ref 4.0–10.5)
nRBC: 0 % (ref 0.0–0.2)

## 2020-08-23 MED ORDER — KETOROLAC TROMETHAMINE 15 MG/ML IJ SOLN
15.0000 mg | Freq: Once | INTRAMUSCULAR | Status: AC
  Administered 2020-08-23: 15 mg via INTRAVENOUS
  Filled 2020-08-23: qty 1

## 2020-08-23 MED ORDER — ONDANSETRON HCL 4 MG/2ML IJ SOLN
4.0000 mg | Freq: Once | INTRAMUSCULAR | Status: AC
  Administered 2020-08-23: 4 mg via INTRAVENOUS
  Filled 2020-08-23: qty 2

## 2020-08-23 MED ORDER — SODIUM CHLORIDE 0.9 % IV BOLUS
1000.0000 mL | Freq: Once | INTRAVENOUS | Status: AC
  Administered 2020-08-23: 1000 mL via INTRAVENOUS

## 2020-08-23 NOTE — ED Provider Notes (Signed)
[facility] Emergency Department Provider Note MRN:  [id number]  Arrival date & time: 08/24/20     Chief Complaint   Fatigue (Covid + 8/7 took 5 day treatment but still fatigue dizziness loss of appetite n ow rash on face and arms. )   History of Present Illness   Sandra Waller is a 65 y.o. year-old female with no pertinent past medical history presenting to the ED with chief complaint of fatigue.  Patient began having COVID symptoms about 8 days ago, tested positive.  Has completed a 5-day course of Paxlovid.  Here this evening because she is not feeling any better.  She has done nothing with sleep for the past 2 days.  She continues to have severe fatigue, malaise.  She denies any chest pain or shortness of breath, no abdominal pain, no nausea vomiting or diarrhea.  She has noticed a mild rash to her bilateral arms and neck.  Symptoms are mild to moderate, constant, no exacerbating or alleviating factors.  Review of Systems  A complete 10 system review of systems was obtained and all systems are negative except as noted in the HPI and PMH.   Patient's Health History    Past Medical History:  Diagnosis Date   Allergy    Anxiety    Arthritis    BACK   Osteopenia 08/2013   T score -2.0. Improved from -2.4   Shingles 06/2011    Past Surgical History:  Procedure Laterality Date   ABDOMINAL HYSTERECTOMY     LAVH   CHOLECYSTECTOMY     CYSTS ON WRIST REMOVED  94   FOOT SURGERY Right    LAVH BSO     OVARIAN CYSTS AND ENDOMETRIOSIS   LEFT SHOUDLER SURGERY     NOSE SURGERY  2002    Family History  Problem Relation Age of Onset   Diabetes Mother    Diabetes Father    Hypertension Sister     Social History   Socioeconomic History   Marital status: Widowed    Spouse name: Not on file   Number of children: Not on file   Years of education: Not on file   Highest education level: Not on file  Occupational History   Not on file  Tobacco Use    Smoking status: Some Days    Packs/day: 0.50    Years: 30.00    Pack years: 15.00    Types: Cigarettes    Last attempt to quit: 06/2018    Years since quitting: 2.2   Smokeless tobacco: Never  Vaping Use   Vaping Use: Never used  Substance and Sexual Activity   Alcohol use: No    Alcohol/week: 0.0 standard drinks   Drug use: No   Sexual activity: Yes    Partners: Male    Birth control/protection: Surgical  Other Topics Concern   Not on file  Social History Narrative   Not on file   Social Determinants of Health   Financial Resource Strain: Not on file  Food Insecurity: Not on file  Transportation Needs: Not on file  Physical Activity: Not on file  Stress: Not on file  Social Connections: Not on file  Intimate Partner Violence: Not on file     Physical Exam   Vitals:   08/23/20 2315 08/24/20 0000  BP: 93/60 100/60  Pulse: 77 61  Resp: 15 17  Temp:    SpO2: 97% 96%    CONSTITUTIONAL: Well-appearing, NAD NEURO:  Alert and oriented  x 3, no focal deficits EYES:  eyes equal and reactive ENT/NECK:  no LAD, no JVD CARDIO: Regular rate, well-perfused, normal S1 and S2 PULM:  CTAB no wheezing or rhonchi GI/GU:  normal bowel sounds, non-distended, non-tender MSK/SPINE:  No gross deformities, no edema SKIN: Faint erythematous papular rash to the right arm and some scattered lesions to the right side of the neck PSYCH:  Appropriate speech and behavior  *Additional and/or pertinent findings included in MDM below  Diagnostic and Interventional Summary    EKG Interpretation  Date/Time:  August 23, 2020 at 23: 15: 22 Ventricular Rate:  74 PR Interval: 188   QRS Duration: 98 QT Interval:  405 QTC Calculation: 450 R Axis:     Text Interpretation: Sinus rhythm, no ischemic findings Confirm by Dr. Kennis Carina at 12:43 AM       Labs Reviewed  BASIC METABOLIC PANEL  CBC  URINALYSIS, ROUTINE W REFLEX MICROSCOPIC    DG Chest 1 View  Final Result       Medications  sodium chloride 0.9 % bolus 1,000 mL (0 mLs Intravenous Stopped 08/24/20 0017)  ketorolac (TORADOL) 15 MG/ML injection 15 mg (15 mg Intravenous Given 08/23/20 2313)  ondansetron (ZOFRAN) injection 4 mg (4 mg Intravenous Given 08/23/20 2313)     Procedures  /  Critical Care Procedures  ED Course and Medical Decision Making  I have reviewed the triage vital signs, the nursing notes, and pertinent available records from the EMR.  Listed above are laboratory and imaging tests that I personally ordered, reviewed, and interpreted and then considered in my medical decision making (see below for details).  Suspect symptoms can be best explained by continued COVID symptoms.  However patient has soft blood pressure here in the emergency department.  No fever, not tachycardic.  Likely having poor p.o. intake, sleeping all day.  EKG reassuring, basic labs reassuring, providing bolus IV fluids, symptomatic management and will reassess.  No increased work of breathing, clear lungs, no hypoxia, anticipating discharge if we can ensure that blood pressure improves.     Work-up is reassuring, patient's blood pressure is improved and she is feeling better, continues to have no increased work of breathing, no hypoxia, patient is appropriate for discharge.  Elmer Sow. Pilar Plate, MD [facility] Emergency [facility] [email or web address]  Final Clinical Impressions(s) / ED Diagnoses     ICD-10-CM   1. COVID-19  U07.1     2. Dehydration  E86.0       ED Discharge Orders     None        Discharge Instructions Discussed with and Provided to Patient:     Discharge Instructions      You were evaluated in the Emergency Department and after careful evaluation, we did not find any emergent condition requiring admission or further testing in the hospital.  Your exam/testing today was overall reassuring.  Symptoms seem to be due to COVID-19 and some dehydration.  Please  increase your fluid intake and continue to rest and recover at home.  Please return to the Emergency Department if you experience any worsening of your condition.  Thank you for allowing [place] to be a part of your care.         Sabas Sous, MD 08/24/20 [phone]

## 2020-08-23 NOTE — ED Notes (Signed)
Son Madelaine Bhat called in asking to be called with any updates from doctor

## 2020-08-23 NOTE — ED Notes (Signed)
States has COVID for one week and continues to feel weak and tired.  Denies chest pain of SOB.

## 2020-08-23 NOTE — ED Triage Notes (Signed)
COVID + 8.7 took 5 day treatment still having fatigue weakness, loss of appetite and rash to face and arms.

## 2020-08-24 LAB — URINALYSIS, ROUTINE W REFLEX MICROSCOPIC
Bilirubin Urine: NEGATIVE
Glucose, UA: NEGATIVE mg/dL
Hgb urine dipstick: NEGATIVE
Ketones, ur: NEGATIVE mg/dL
Leukocytes,Ua: NEGATIVE
Nitrite: NEGATIVE
Protein, ur: NEGATIVE mg/dL
Specific Gravity, Urine: 1.01 (ref 1.005–1.030)
pH: 6 (ref 5.0–8.0)

## 2020-08-24 NOTE — ED Notes (Signed)
ED Provider at bedside. 

## 2020-08-24 NOTE — ED Notes (Signed)
Phone Call Note: Son of pt called and was inquiring what medications his mother rec while here in the ED. Son was able to verify DOB, full name and address of pt prior to sharing information with him. Also discussed monitoring his mother's PO intake and observe for other signs and symptoms that may require her return to the ED. Opportunity for questions provided prior to discontinuation of phone call.

## 2020-08-24 NOTE — Discharge Instructions (Addendum)
You were evaluated in the Emergency Department and after careful evaluation, we did not find any emergent condition requiring admission or further testing in the hospital.  Your exam/testing today was overall reassuring.  Symptoms seem to be due to COVID-19 and some dehydration.  Please increase your fluid intake and continue to rest and recover at home.  Please return to the Emergency Department if you experience any worsening of your condition.  Thank you for allowing [place] to be a part of your care.

## 2021-02-11 ENCOUNTER — Other Ambulatory Visit: Payer: Self-pay

## 2021-02-11 ENCOUNTER — Encounter (HOSPITAL_BASED_OUTPATIENT_CLINIC_OR_DEPARTMENT_OTHER): Payer: Self-pay | Admitting: Orthopedic Surgery

## 2021-02-11 ENCOUNTER — Other Ambulatory Visit: Payer: Self-pay | Admitting: Orthopedic Surgery

## 2021-02-15 ENCOUNTER — Encounter (HOSPITAL_BASED_OUTPATIENT_CLINIC_OR_DEPARTMENT_OTHER): Payer: Self-pay | Admitting: Orthopedic Surgery

## 2021-02-15 ENCOUNTER — Ambulatory Visit (HOSPITAL_BASED_OUTPATIENT_CLINIC_OR_DEPARTMENT_OTHER): Payer: BC Managed Care – PPO | Admitting: Anesthesiology

## 2021-02-15 ENCOUNTER — Other Ambulatory Visit: Payer: Self-pay

## 2021-02-15 ENCOUNTER — Encounter (HOSPITAL_BASED_OUTPATIENT_CLINIC_OR_DEPARTMENT_OTHER)
Admission: RE | Disposition: A | Discharge status: Home / Self Care | Payer: Self-pay | Source: Home / Self Care | Attending: Orthopedic Surgery

## 2021-02-15 ENCOUNTER — Ambulatory Visit (HOSPITAL_BASED_OUTPATIENT_CLINIC_OR_DEPARTMENT_OTHER)
Admission: RE | Admit: 2021-02-15 | Discharge: 2021-02-15 | Disposition: A | Discharge status: Home / Self Care | Payer: BC Managed Care – PPO | Attending: Orthopedic Surgery | Admitting: Orthopedic Surgery

## 2021-02-15 DIAGNOSIS — G5601 Carpal tunnel syndrome, right upper limb: Secondary | ICD-10-CM | POA: Diagnosis not present

## 2021-02-15 DIAGNOSIS — F17210 Nicotine dependence, cigarettes, uncomplicated: Secondary | ICD-10-CM | POA: Diagnosis not present

## 2021-02-15 DIAGNOSIS — M1990 Unspecified osteoarthritis, unspecified site: Secondary | ICD-10-CM | POA: Insufficient documentation

## 2021-02-15 HISTORY — DX: Dizziness and giddiness: R42

## 2021-02-15 HISTORY — DX: Other complications of anesthesia, initial encounter: T88.59XA

## 2021-02-15 HISTORY — DX: Family history of other specified conditions: Z84.89

## 2021-02-15 HISTORY — PX: CARPAL TUNNEL RELEASE: SHX101

## 2021-02-15 HISTORY — DX: Cerebral aneurysm, nonruptured: I67.1

## 2021-02-15 SURGERY — CARPAL TUNNEL RELEASE
Anesthesia: Monitor Anesthesia Care | Site: Wrist | Laterality: Right

## 2021-02-15 MED ORDER — ONDANSETRON HCL 4 MG/2ML IJ SOLN
INTRAMUSCULAR | Status: DC | PRN
  Administered 2021-02-15: 4 mg via INTRAVENOUS

## 2021-02-15 MED ORDER — OXYCODONE HCL 5 MG/5ML PO SOLN: 5.0000 mg | Freq: Once | ORAL | Status: DC | PRN

## 2021-02-15 MED ORDER — ONDANSETRON HCL 4 MG/2ML IJ SOLN: 4.0000 mg | Freq: Once | INTRAMUSCULAR | Status: DC | PRN

## 2021-02-15 MED ORDER — PROPOFOL 500 MG/50ML IV EMUL
INTRAVENOUS | Status: DC | PRN
Start: 2021-02-15 — End: 2021-02-15
  Administered 2021-02-15: 50 ug/kg/min via INTRAVENOUS

## 2021-02-15 MED ORDER — LACTATED RINGERS IV SOLN: INTRAVENOUS | Status: DC

## 2021-02-15 MED ORDER — HYDROCODONE-ACETAMINOPHEN 5-325 MG PO TABS
ORAL_TABLET | ORAL | 0 refills | Status: DC
Start: 2021-02-15 — End: 2022-09-04

## 2021-02-15 MED ORDER — ONDANSETRON HCL 4 MG/2ML IJ SOLN
INTRAMUSCULAR | Status: AC
  Filled 2021-02-15: qty 2

## 2021-02-15 MED ORDER — OXYCODONE HCL 5 MG PO TABS: 5.0000 mg | ORAL_TABLET | Freq: Once | ORAL | Status: DC | PRN

## 2021-02-15 MED ORDER — ACETAMINOPHEN 500 MG PO TABS
1000.0000 mg | ORAL_TABLET | Freq: Once | ORAL | Status: AC
  Administered 2021-02-15: 1000 mg via ORAL

## 2021-02-15 MED ORDER — ACETAMINOPHEN 500 MG PO TABS
ORAL_TABLET | ORAL | Status: AC
  Filled 2021-02-15: qty 2

## 2021-02-15 MED ORDER — CEFAZOLIN SODIUM-DEXTROSE 2-4 GM/100ML-% IV SOLN
INTRAVENOUS | Status: AC
  Filled 2021-02-15: qty 100

## 2021-02-15 MED ORDER — LIDOCAINE HCL (PF) 0.5 % IJ SOLN
INTRAMUSCULAR | Status: DC | PRN
  Administered 2021-02-15: 30 mL via INTRAVENOUS

## 2021-02-15 MED ORDER — BUPIVACAINE HCL (PF) 0.25 % IJ SOLN
INTRAMUSCULAR | Status: DC | PRN
  Administered 2021-02-15: 9 mL

## 2021-02-15 MED ORDER — MIDAZOLAM HCL 5 MG/5ML IJ SOLN
INTRAMUSCULAR | Status: DC | PRN
  Administered 2021-02-15: 2 mg via INTRAVENOUS

## 2021-02-15 MED ORDER — FENTANYL CITRATE (PF) 100 MCG/2ML IJ SOLN
INTRAMUSCULAR | Status: DC | PRN
  Administered 2021-02-15 (×2): 50 ug via INTRAVENOUS

## 2021-02-15 MED ORDER — PROPOFOL 10 MG/ML IV BOLUS
INTRAVENOUS | Status: DC | PRN
  Administered 2021-02-15 (×3): 10 mg via INTRAVENOUS

## 2021-02-15 MED ORDER — PROPOFOL 10 MG/ML IV BOLUS
INTRAVENOUS | Status: AC
  Filled 2021-02-15: qty 20

## 2021-02-15 MED ORDER — MIDAZOLAM HCL 2 MG/2ML IJ SOLN
INTRAMUSCULAR | Status: AC
  Filled 2021-02-15: qty 2

## 2021-02-15 MED ORDER — 0.9 % SODIUM CHLORIDE (POUR BTL) OPTIME
TOPICAL | Status: DC | PRN
  Administered 2021-02-15: 60 mL

## 2021-02-15 MED ORDER — FENTANYL CITRATE (PF) 100 MCG/2ML IJ SOLN: 25.0000 ug | INTRAMUSCULAR | Status: DC | PRN

## 2021-02-15 MED ORDER — FENTANYL CITRATE (PF) 100 MCG/2ML IJ SOLN
INTRAMUSCULAR | Status: AC
  Filled 2021-02-15: qty 2

## 2021-02-15 MED ORDER — CEFAZOLIN SODIUM-DEXTROSE 2-4 GM/100ML-% IV SOLN
2.0000 g | INTRAVENOUS | Status: AC
  Administered 2021-02-15: 2 g via INTRAVENOUS

## 2021-02-15 SURGICAL SUPPLY — 33 items
APL PRP STRL LF DISP 70% ISPRP (MISCELLANEOUS) ×1
BLADE SURG 15 STRL LF DISP TIS (BLADE) ×4 IMPLANT
BLADE SURG 15 STRL SS (BLADE) ×4
BNDG ELASTIC 3X5.8 VLCR STR LF (GAUZE/BANDAGES/DRESSINGS) ×3 IMPLANT
BNDG GAUZE ELAST 4 BULKY (GAUZE/BANDAGES/DRESSINGS) ×3 IMPLANT
CHLORAPREP W/TINT 26 (MISCELLANEOUS) ×3 IMPLANT
CORD BIPOLAR FORCEPS 12FT (ELECTRODE) ×3 IMPLANT
COVER BACK TABLE 60X90IN (DRAPES) ×3 IMPLANT
COVER MAYO STAND STRL (DRAPES) ×3 IMPLANT
DRAPE EXTREMITY T 121X128X90 (DISPOSABLE) ×3 IMPLANT
DRAPE U-SHAPE 47X51 STRL (DRAPES) ×1 IMPLANT
DRSG PAD ABDOMINAL 8X10 ST (GAUZE/BANDAGES/DRESSINGS) ×3 IMPLANT
GAUZE SPONGE 4X4 12PLY STRL (GAUZE/BANDAGES/DRESSINGS) ×3 IMPLANT
GAUZE XEROFORM 1X8 LF (GAUZE/BANDAGES/DRESSINGS) ×3 IMPLANT
GLOVE SRG 8 PF TXTR STRL LF DI (GLOVE) ×2 IMPLANT
GLOVE SURG POLYISO LF SZ6.5 (GLOVE) ×1 IMPLANT
GLOVE SURG POLYISO LF SZ7.5 (GLOVE) ×1 IMPLANT
GLOVE SURG UNDER POLY LF SZ8 (GLOVE) ×2
GOWN STRL REUS W/ TWL LRG LVL3 (GOWN DISPOSABLE) ×2 IMPLANT
GOWN STRL REUS W/TWL LRG LVL3 (GOWN DISPOSABLE) ×2
GOWN STRL REUS W/TWL XL LVL3 (GOWN DISPOSABLE) ×3 IMPLANT
NDL HYPO 25X1 1.5 SAFETY (NEEDLE) ×2 IMPLANT
NEEDLE HYPO 25X1 1.5 SAFETY (NEEDLE) ×2 IMPLANT
NS IRRIG 1000ML POUR BTL (IV SOLUTION) ×3 IMPLANT
PACK BASIN DAY SURGERY FS (CUSTOM PROCEDURE TRAY) ×3 IMPLANT
PADDING CAST ABS 4INX4YD NS (CAST SUPPLIES) ×1
PADDING CAST ABS COTTON 4X4 ST (CAST SUPPLIES) ×2 IMPLANT
STOCKINETTE 4X48 STRL (DRAPES) ×3 IMPLANT
SUT ETHILON 4 0 PS 2 18 (SUTURE) ×3 IMPLANT
SYR BULB EAR ULCER 3OZ GRN STR (SYRINGE) ×3 IMPLANT
SYR CONTROL 10ML LL (SYRINGE) ×3 IMPLANT
TOWEL GREEN STERILE FF (TOWEL DISPOSABLE) ×6 IMPLANT
UNDERPAD 30X36 HEAVY ABSORB (UNDERPADS AND DIAPERS) ×3 IMPLANT

## 2021-02-15 NOTE — Anesthesia Procedure Notes (Signed)
Anesthesia Regional Block: Bier block (IV Regional)   Pre-Anesthetic Checklist: , timeout performed,  Correct Patient, Correct Site, Correct Laterality,  Correct Procedure,, site marked,  Surgical consent,  At surgeon's request  Laterality: Right         Needles:  Injection technique: Single-shot  Needle Type: Other      Needle Gauge: 20     Additional Needles:   Procedures:,,,,, intact distal pulses, Esmarch exsanguination,  Single tourniquet utilized    Narrative:  Start time: 02/15/2021 1:18 PM End time: 02/15/2021 1:14 PM Injection made incrementally with aspirations every 30 mL.  Performed by: Personally

## 2021-02-15 NOTE — Transfer of Care (Signed)
Immediate Anesthesia Transfer of Care Note  Patient: Sandra Waller  Procedure(s) Performed: CARPAL TUNNEL RELEASE RIGHT (Right: Wrist)  Patient Location: PACU  Anesthesia Type:MAC and Bier block  Level of Consciousness: awake, alert  and oriented  Airway & Oxygen Therapy: Patient Spontanous Breathing and Patient connected to face mask oxygen  Post-op Assessment: Report given to RN and Post -op Vital signs reviewed and stable  Post vital signs: Reviewed and stable  Last Vitals:  Vitals Value Taken Time  BP 133/75 02/15/21 1347  Temp    Pulse 58 02/15/21 1349  Resp 13 02/15/21 1349  SpO2 98 % 02/15/21 1349  Vitals shown include unvalidated device data.  Last Pain:  Vitals:   02/15/21 1128  TempSrc: Oral  PainSc: 0-No pain         Complications: No notable events documented.

## 2021-02-15 NOTE — Anesthesia Preprocedure Evaluation (Addendum)
Anesthesia Evaluation  Patient identified by MRN, date of birth, ID band Patient awake    Reviewed: Allergy & Precautions, NPO status , Patient's Chart, lab work & pertinent test results  History of Anesthesia Complications (+) PROLONGED EMERGENCE and history of anesthetic complications  Airway Mallampati: II  TM Distance: >3 FB Neck ROM: Full    Dental no notable dental hx.    Pulmonary Current Smoker,    Pulmonary exam normal breath sounds clear to auscultation       Cardiovascular Exercise Tolerance: Good Normal cardiovascular exam Rhythm:Regular Rate:Normal  ICA aneurysm   Neuro/Psych PSYCHIATRIC DISORDERS Anxiety negative neurological ROS     GI/Hepatic negative GI ROS, Neg liver ROS,   Endo/Other  negative endocrine ROS  Renal/GU negative Renal ROS  negative genitourinary   Musculoskeletal  (+) Arthritis , Osteoarthritis,    Abdominal   Peds  Hematology negative hematology ROS (+)   Anesthesia Other Findings   Reproductive/Obstetrics negative OB ROS                            Anesthesia Physical Anesthesia Plan  ASA: 2  Anesthesia Plan: MAC and Bier Block and Bier Block-LIDOCAINE ONLY   Post-op Pain Management: Tylenol PO (pre-op)   Induction:   PONV Risk Score and Plan: 2 and Propofol infusion and TIVA  Airway Management Planned: Natural Airway and Simple Face Mask  Additional Equipment: None  Intra-op Plan:   Post-operative Plan:   Informed Consent: I have reviewed the patients History and Physical, chart, labs and discussed the procedure including the risks, benefits and alternatives for the proposed anesthesia with the patient or authorized representative who has indicated his/her understanding and acceptance.     Dental advisory given  Plan Discussed with: CRNA, Anesthesiologist and Surgeon  Anesthesia Plan Comments:        Anesthesia Quick  Evaluation

## 2021-02-15 NOTE — Anesthesia Postprocedure Evaluation (Signed)
Anesthesia Post Note  Patient: Sandra Waller  Procedure(s) Performed: CARPAL TUNNEL RELEASE RIGHT (Right: Wrist)     Patient location during evaluation: PACU Anesthesia Type: MAC Level of consciousness: awake and alert Pain management: pain level controlled Vital Signs Assessment: post-procedure vital signs reviewed and stable Respiratory status: spontaneous breathing and respiratory function stable Cardiovascular status: stable Postop Assessment: no apparent nausea or vomiting Anesthetic complications: no   No notable events documented.  Last Vitals:  Vitals:   02/15/21 1400 02/15/21 1410  BP: 125/74 131/70  Pulse: (!) 59 74  Resp: 14 16  Temp:  36.4 C  SpO2: 94% 96%    Last Pain:  Vitals:   02/15/21 1410  TempSrc:   PainSc: 0-No pain                 Merlinda Frederick

## 2021-02-15 NOTE — H&P (Signed)
Sandra Waller is an 66 y.o. female.   Chief Complaint: carpal tunnel syndrome HPI: 66 yo female with numbness and tingling right hand.  Positive nerve conduction studies.  Nocturnal symptoms.  She wishes to have right carpal tunnel release.  Allergies:  Allergies  Allergen Reactions   Wasp Venom Swelling   Celebrex [Celecoxib] Nausea Only   Lipitor [Atorvastatin Calcium]     Aches and pains and malaise   Paxlovid [Nirmatrelvir-Ritonavir] Nausea And Vomiting    Pt states "delusional"   Rosuvastatin     Joint aches   Z-Pak [Azithromycin] Nausea Only   Adhesive [Tape] Rash   Latex Rash    Past Medical History:  Diagnosis Date   Allergy    Anxiety    Arthritis    BACK   Complication of anesthesia    "difficulty waking up"   Family history of adverse reaction to anesthesia    sister with difficulty waking up   Internal carotid aneurysm    cleared by neuro 05/24/20. follow up as needed   Osteopenia 08/09/2013   T score -2.0. Improved from -2.4   Shingles 06/10/2011   Vertigo     Past Surgical History:  Procedure Laterality Date   ABDOMINAL HYSTERECTOMY     LAVH   CHOLECYSTECTOMY     CYSTS ON WRIST REMOVED  94   FOOT SURGERY Right    LAVH BSO     OVARIAN CYSTS AND ENDOMETRIOSIS   LEFT SHOUDLER SURGERY     NOSE SURGERY  2002    Family History: Family History  Problem Relation Age of Onset   Diabetes Mother    Diabetes Father    Hypertension Sister     Social History:   reports that she has been smoking cigarettes. She has a 15.00 pack-year smoking history. She has never used smokeless tobacco. She reports that she does not drink alcohol and does not use drugs.  Medications: Medications Prior to Admission  Medication Sig Dispense Refill   B Complex-C (SUPER B COMPLEX PO) Take 1 capsule by mouth daily.     Cholecalciferol (D3 2000 PO) Take by mouth.     COLLAGEN PO Take by mouth.     diclofenac (VOLTAREN) 75 MG EC tablet Take 75 mg by mouth 2 (two) times  daily.     doxycycline (VIBRAMYCIN) 100 MG capsule Take 100 mg by mouth 2 (two) times daily.     Evolocumab (REPATHA) 140 MG/ML SOSY Inject into the skin.     FLUoxetine (PROZAC) 10 MG tablet Take 20 mg by mouth daily.     gabapentin (NEURONTIN) 300 MG capsule Take 300 mg by mouth daily.     Levocetirizine Dihydrochloride (XYZAL ALLERGY 24HR PO) Take by mouth.     Magnesium 500 MG CAPS Take by mouth.     triazolam (HALCION) 0.25 MG tablet TAKE TWO TABLETS BY MOUTH AT BEDTIME AS NEEDED 68 tablet 5   TURMERIC CURCUMIN PO Take 500 mg by mouth.     fluticasone (FLONASE) 50 MCG/ACT nasal spray Place 2 sprays into both nostrils daily. 16 g 0    No results found for this or any previous visit (from the past 48 hour(s)).  No results found.    Blood pressure 120/66, pulse 61, temperature 97.9 F (36.6 C), temperature source Oral, resp. rate 17, height 5\' 2"  (1.575 m), weight 78.2 kg, SpO2 97 %.  General appearance: alert, cooperative, and appears stated age Head: Normocephalic, without obvious abnormality, atraumatic Neck: supple, symmetrical, trachea  midline Extremities: Intact sensation and capillary refill all digits.  +epl/fpl/io.  No wounds.  Pulses: 2+ and symmetric Skin: Skin color, texture, turgor normal. No rashes or lesions Neurologic: Grossly normal Incision/Wound: none  Assessment/Plan Right carpal tunnel syndrome.  Non operative and operative treatment options have been discussed with the patient and patient wishes to proceed with operative treatment. Risks, benefits, and alternatives of surgery have been discussed and the patient agrees with the plan of care.   Betha Loa 02/15/2021, 12:20 PM

## 2021-02-15 NOTE — Discharge Instructions (Addendum)
°  Post Anesthesia Home Care Instructions  Activity: Get plenty of rest for the remainder of the day. A responsible individual must stay with you for 24 hours following the procedure.  For the next 24 hours, DO NOT: -Drive a car -Advertising copywriter -Drink alcoholic beverages -Take any medication unless instructed by your physician -Make any legal decisions or sign important papers.  Meals: Start with liquid foods such as gelatin or soup. Progress to regular foods as tolerated. Avoid greasy, spicy, heavy foods. If nausea and/or vomiting occur, drink only clear liquids until the nausea and/or vomiting subsides. Call your physician if vomiting continues.  Special Instructions/Symptoms: Your throat may feel dry or sore from the anesthesia or the breathing tube placed in your throat during surgery. If this causes discomfort, gargle with warm salt water. The discomfort should disappear within 24 hours.  If you had a scopolamine patch placed behind your ear for the management of post- operative nausea and/or vomiting:  1. The medication in the patch is effective for 72 hours, after which it should be removed.  Wrap patch in a tissue and discard in the trash. Wash hands thoroughly with soap and water. 2. You may remove the patch earlier than 72 hours if you experience unpleasant side effects which may include dry mouth, dizziness or visual disturbances. 3. Avoid touching the patch. Wash your hands with soap and water after contact with the patch.    Next tylenol 5:30pm  [facility] Instructions Hand Surgery  Wound Care: Keep your hand elevated above the level of your heart.  Do not allow it to dangle by your side.  Keep the dressing dry and do not remove it unless your doctor advises you to do so.  He will usually change it at the time of your post-op visit.  Moving your fingers is advised to stimulate circulation but will depend on the site of your surgery.  If you have a splint applied, your  doctor will advise you regarding movement.  Activity: Do not drive or operate machinery today.  Rest today and then you may return to your normal activity and work as indicated by your physician.  Diet:  Drink liquids today or eat a light diet.  You may resume a regular diet tomorrow.    General expectations: Pain for two to three days. Fingers may become slightly swollen.  Call your doctor if any of the following occur: Severe pain not relieved by pain medication. Elevated temperature. Dressing soaked with blood. Inability to move fingers. White or bluish color to fingers.

## 2021-02-15 NOTE — Op Note (Signed)
02/15/2021 [facility]                              OPERATIVE REPORT   PREOPERATIVE DIAGNOSIS:  Right carpal tunnel syndrome.  POSTOPERATIVE DIAGNOSIS:  Right carpal tunnel syndrome.  PROCEDURE:  Right carpal tunnel release.  SURGEON:  Betha Loa, MD  ASSISTANT:  none.  ANESTHESIA: Bier block with sedation  IV FLUIDS:  Per anesthesia flow sheet.  ESTIMATED BLOOD LOSS:  Minimal.  COMPLICATIONS:  None.  SPECIMENS:  None.  TOURNIQUET TIME:    Total Tourniquet Time Documented: Forearm (Right) - 23 minutes Total: Forearm (Right) - 23 minutes   DISPOSITION:  Stable to PACU.  LOCATION: [facility]  INDICATIONS:  66 yo female with numbness and tingling right hand.  Nocturnal symptoms.  Positive nerve conduction studies.  She wishes to have a carpal tunnel release for management of her symptoms.  Risks, benefits and alternatives of surgery were discussed including the risk of blood loss; infection; damage to nerves, vessels, tendons, ligaments, bone; failure of surgery; need for additional surgery; complications with wound healing; continued pain; recurrence of carpal tunnel syndrome; and damage to motor branch. She voiced understanding of these risks and elected to proceed.   OPERATIVE COURSE:  After being identified preoperatively by myself, the patient and I agreed upon the procedure and site of procedure.  The surgical site was marked.  Surgical consent had been signed.  She was given preoperative IV antibiotic prophylaxis.  She was transferred to the operating room and placed on the operating room table in supine position with the Right upper extremity on an armboard.  Bier block anesthesia was induced by the anesthesiologist.  Right upper extremity was prepped and draped in normal sterile orthopaedic fashion.  A surgical pause was performed between the surgeons, anesthesia, and operating room staff, and all were in agreement as to the patient, procedure,  and site of procedure.  Tourniquet at the proximal aspect of the forearm had been inflated for the Bier block  Incision was made over the transverse carpal ligament and carried into the subcutaneous tissues by spreading technique.  Bipolar electrocautery was used to obtain hemostasis.  The palmar fascia was sharply incised.  The transverse carpal ligament was identified and sharply incised.  It was incised distally first.  The flexor tendons were identified.  The flexor tendon to the little finger was identified and retracted radially.  The transverse carpal ligament was then incised proximally.  Scissors were used to split the distal aspect of the volar antebrachial fascia.  A finger was placed into the wound to ensure complete decompression, which was the case.  The nerve was examined.  It was flattened and hyperemic and it was adherent to the radial leaflet.  The motor branch was identified and was intact.  The wound was copiously irrigated with sterile saline.  It was then closed with 4-0 nylon in a horizontal mattress fashion.  It was injected with 0.25% plain Marcaine to aid in postoperative analgesia.  It was dressed with sterile Xeroform, 4x4s, an ABD, and wrapped with Kerlix and an Ace bandage.  Tourniquet was deflated at 23 minutes.  Fingertips were pink with brisk capillary refill after deflation of the tourniquet.  Operative drapes were broken down.  The patient was awoken from anesthesia safely.  She was transferred back to stretcher and taken to the PACU in stable condition.  I will see her  back in the office in 1 week for postoperative followup.  I will give her a prescription for Norco 5/325 1-2 tabs PO q6 hours prn pain, dispense # 15.    Betha Loa, MD Electronically signed, 02/15/21

## 2021-02-17 ENCOUNTER — Encounter (HOSPITAL_BASED_OUTPATIENT_CLINIC_OR_DEPARTMENT_OTHER): Payer: Self-pay | Admitting: Orthopedic Surgery

## 2022-09-04 ENCOUNTER — Ambulatory Visit: Payer: Medicare HMO | Admitting: Diagnostic Neuroimaging

## 2022-09-04 ENCOUNTER — Encounter: Payer: Self-pay | Admitting: Diagnostic Neuroimaging

## 2022-09-04 VITALS — BP 107/66 | HR 73 | Ht 62.0 in | Wt 176.0 lb

## 2022-09-04 DIAGNOSIS — G629 Polyneuropathy, unspecified: Secondary | ICD-10-CM

## 2022-09-04 NOTE — Patient Instructions (Signed)
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

## 2022-09-04 NOTE — Progress Notes (Signed)
[facility]  PATIENT: Sandra Waller DOB: 1955-09-06  REFERRING CLINICIAN: Ernest Mallick, PA* HISTORY FROM: patient REASON FOR VISIT: new consult   HISTORICAL  CHIEF COMPLAINT:  Chief Complaint  Patient presents with   New Patient (Initial Visit)    Patient in room #6 and alone. Patient states she here to discuss peripheral neuropathy.    HISTORY OF PRESENT ILLNESS:   67 year old female here for evaluation of numbness and tingling in the feet.  For past 1 year has had numbness and burning sensation in the bottom of the feet, gradually worsening over time.  Has chronic low back pain and neck pain issues, managed conservatively in the past with injections and exercises.  Family history of neuropathy in father who also had issues with alcohol abuse and other medical issues.  Patient does not have diabetes or alcohol use.  Lab testing is been unremarkable so far.  On gabapentin 600 mg twice a day with some relief in pain.   REVIEW OF SYSTEMS: Full 14 system review of systems performed and negative with exception of: as per HPI.  ALLERGIES: Allergies  Allergen Reactions   Wasp Venom Swelling   Celebrex [Celecoxib] Nausea Only   Lipitor [Atorvastatin Calcium]     Aches and pains and malaise   Paxlovid [Nirmatrelvir-Ritonavir] Nausea And Vomiting    Pt states "delusional"   Rosuvastatin     Joint aches   Z-Pak [Azithromycin] Nausea Only   Adhesive [Tape] Rash   Latex Rash    HOME MEDICATIONS: Outpatient Medications Prior to Visit  Medication Sig Dispense Refill   B Complex-C (SUPER B COMPLEX PO) Take 1 capsule by mouth daily.     Cholecalciferol (D3 2000 PO) Take by mouth.     COLLAGEN PO Take by mouth.     diclofenac (VOLTAREN) 75 MG EC tablet Take 75 mg by mouth 2 (two) times daily.     doxycycline (VIBRAMYCIN) 100 MG capsule Take 100 mg by mouth 2 (two) times daily.     Evolocumab (REPATHA) 140 MG/ML SOSY Inject into the skin.      FLUoxetine (PROZAC) 10 MG tablet Take 20 mg by mouth daily.     fluticasone (FLONASE) 50 MCG/ACT nasal spray Place 2 sprays into both nostrils daily. 16 g 0   gabapentin (NEURONTIN) 300 MG capsule Take 300 mg by mouth daily.     Levocetirizine Dihydrochloride (XYZAL ALLERGY 24HR PO) Take by mouth.     Magnesium 500 MG CAPS Take by mouth.     triazolam (HALCION) 0.25 MG tablet TAKE TWO TABLETS BY MOUTH AT BEDTIME AS NEEDED 68 tablet 5   TURMERIC CURCUMIN PO Take 500 mg by mouth.     HYDROcodone-acetaminophen (NORCO/VICODIN) 5-325 MG tablet 1-2 tabs PO q6 hours prn pain 15 tablet 0   No facility-administered medications prior to visit.    PAST MEDICAL HISTORY: Past Medical History:  Diagnosis Date   Allergy    Anxiety    Arthritis    BACK   Complication of anesthesia    "difficulty waking up"   Family history of adverse reaction to anesthesia    sister with difficulty waking up   Internal carotid aneurysm    cleared by neuro 05/24/20. follow up as needed   Osteopenia 08/09/2013   T score -2.0. Improved from -2.4   Shingles 06/10/2011   Vertigo     PAST SURGICAL HISTORY: Past Surgical History:  Procedure Laterality Date   ABDOMINAL HYSTERECTOMY  LAVH   CARPAL TUNNEL RELEASE Right 02/15/2021   Procedure: CARPAL TUNNEL RELEASE RIGHT;  Surgeon: Betha Loa, MD;  Location:  SURGERY CENTER;  Service: Orthopedics;  Laterality: Right;   CHOLECYSTECTOMY     CYSTS ON WRIST REMOVED  94   FOOT SURGERY Right    LAVH BSO     OVARIAN CYSTS AND ENDOMETRIOSIS   LEFT SHOUDLER SURGERY     NOSE SURGERY  2002    FAMILY HISTORY: Family History  Problem Relation Age of Onset   Diabetes Mother    Diabetes Father    Hypertension Sister     SOCIAL HISTORY: Social History   Socioeconomic History   Marital status: Widowed    Spouse name: Not on file   Number of children: Not on file   Years of education: Not on file   Highest education level: Not on file  Occupational  History   Not on file  Tobacco Use   Smoking status: Every Day    Current packs/day: 0.00    Average packs/day: 0.5 packs/day for 30.0 years (15.0 ttl pk-yrs)    Types: Cigarettes    Start date: 06/1988    Last attempt to quit: 06/2018    Years since quitting: 4.2   Smokeless tobacco: Never  Vaping Use   Vaping status: Never Used  Substance and Sexual Activity   Alcohol use: No    Alcohol/week: 0.0 standard drinks of alcohol   Drug use: No   Sexual activity: Yes    Partners: Male    Birth control/protection: Surgical  Other Topics Concern   Not on file  Social History Narrative   Not on file   Social Determinants of Health   Financial Resource Strain: Low Risk  (06/18/2022)   Received from [facility]   Overall Financial Resource Strain (CARDIA)    Difficulty of Paying Living Expenses: Not very hard  Food Insecurity: No Food Insecurity (06/18/2022)   Received from [facility]   Hunger Vital Sign    Worried About Running Out of Food in the Last Year: Never true    Ran Out of Food in the Last Year: Never true  Transportation Needs: No Transportation Needs (06/18/2022)   Received from [facility] - Transportation    Lack of Transportation (Medical): No    Lack of Transportation (Non-Medical): No  Physical Activity: Insufficiently Active (06/18/2022)   Received from [facility]   Exercise Vital Sign    Days of Exercise per Week: 2 days    Minutes of Exercise per Session: 10 min  Stress: No Stress Concern Present (06/18/2022)   Received from [facility] - Occupational Stress Questionnaire    Feeling of Stress : Only a little  Social Connections: Socially Integrated (06/18/2022)   Received from [facility]   Social Network    How would you rate your social network (family, work, friends)?: Good participation with social networks  Intimate Partner Violence: Not At Risk (06/18/2022)   Received from [facility]   HITS     Over the last 12 months how often did your partner physically hurt you?: 1    Over the last 12 months how often did your partner insult you or talk down to you?: 1    Over the last 12 months how often did your partner threaten you with physical harm?: 1    Over the last 12 months how often did your partner scream  or curse at you?: 1     PHYSICAL EXAM  GENERAL EXAM/CONSTITUTIONAL: Vitals:  Vitals:   09/04/22 0844  BP: 107/66  Pulse: 73  Weight: 176 lb (79.8 kg)  Height: 5\' 2"  (1.575 m)   Body mass index is 32.19 kg/m. Wt Readings from Last 3 Encounters:  09/04/22 176 lb (79.8 kg)  02/15/21 172 lb 6.4 oz (78.2 kg)  08/23/20 150 lb (68 kg)   Patient is in no distress; well developed, nourished and groomed; neck is supple  CARDIOVASCULAR: Examination of carotid arteries is normal; no carotid bruits Regular rate and rhythm, no murmurs Examination of peripheral vascular system by observation and palpation is normal  EYES: Ophthalmoscopic exam of optic discs and posterior segments is normal; no papilledema or hemorrhages No results found.  MUSCULOSKELETAL: Gait, strength, tone, movements noted in Neurologic exam below  NEUROLOGIC: MENTAL STATUS:      No data to display         awake, alert, oriented to person, place and time recent and remote memory intact normal attention and concentration language fluent, comprehension intact, naming intact fund of knowledge appropriate  CRANIAL NERVE:  2nd - no papilledema on fundoscopic exam 2nd, 3rd, 4th, 6th - pupils equal and reactive to light, visual fields full to confrontation, extraocular muscles intact, no nystagmus 5th - facial sensation symmetric 7th - facial strength symmetric 8th - hearing intact 9th - palate elevates symmetrically, uvula midline 11th - shoulder shrug symmetric 12th - tongue protrusion midline  MOTOR:  normal bulk and tone, full strength in the BUE, BLE  SENSORY:  normal and symmetric to  light touch, pinprick, temperature, vibration; EXCEPT SLIGHTLY DECR IN TOES TO PP  COORDINATION:  finger-nose-finger, fine finger movements normal  REFLEXES:  deep tendon reflexes TRACE and symmetric  GAIT/STATION:  narrow based gait     DIAGNOSTIC DATA (LABS, IMAGING, TESTING) - I reviewed patient records, labs, notes, testing and imaging myself where available.  Lab Results  Component Value Date   WBC 8.9 08/23/2020   HGB 13.2 08/23/2020   HCT 38.8 08/23/2020   MCV 94.9 08/23/2020   PLT 223 08/23/2020      Component Value Date/Time   NA 141 08/23/2020 2133   K 4.1 08/23/2020 2133   CL 105 08/23/2020 2133   CO2 30 08/23/2020 2133   GLUCOSE 81 08/23/2020 2133   BUN 8 08/23/2020 2133   CREATININE 0.66 08/23/2020 2133   CREATININE 0.65 05/06/2014 1248   CALCIUM 9.0 08/23/2020 2133   CALCIUM 9.4 08/15/2010 1145   PROT 6.8 05/06/2014 1248   ALBUMIN 4.2 05/06/2014 1248   AST 22 05/06/2014 1248   ALT 18 05/06/2014 1248   ALKPHOS 98 05/06/2014 1248   BILITOT 0.3 05/06/2014 1248   GFRNONAA >60 08/23/2020 2133   GFRNONAA 83 05/28/2013 1133   GFRAA >89 05/28/2013 1133   Lab Results  Component Value Date   CHOL 222 (H) 05/06/2014   HDL 65 05/06/2014   LDLCALC 136 (H) 05/06/2014   TRIG 107 05/06/2014   CHOLHDL 3.4 05/06/2014   No results found for: "HGBA1C" No results found for: "VITAMINB12" Lab Results  Component Value Date   TSH 0.35 06/12/2014    Component Ref Range & Units 2 mo ago Comments  Vitamin B-12 232 - 1245 pg/mL 853    Component Ref Range & Units 2 mo ago Comments  Hemoglobin A1c 4.8 - 5.6 % 5.5     04/26/09/ MRI lumbar spine - Overall mild spondylosis of  the lumbar spine appears most notable at L5-S1 where there is mild central canal and lateral recess narrowing.    ASSESSMENT AND PLAN  67 y.o. year old female here with:  Dx:  1. 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    Suanne Marker, MD 09/04/2022, 9:35 AM Certified in Neurology, Neurophysiology and Neuroimaging  [facility] [address] [place], [place] [address] [phone]
